# Patient Record
Sex: Female | Born: 1975
Health system: Southern US, Community
[De-identification: ages and names within clinical notes are randomized; demographics above are authoritative.]

---

## 1999-01-10 ENCOUNTER — Other Ambulatory Visit: Admission: RE | Admit: 1999-01-10 | Discharge: 1999-01-10 | Payer: Self-pay | Admitting: Family Medicine

## 2000-03-11 ENCOUNTER — Other Ambulatory Visit: Admission: RE | Admit: 2000-03-11 | Discharge: 2000-03-11 | Payer: Self-pay | Admitting: Obstetrics and Gynecology

## 2001-03-23 ENCOUNTER — Other Ambulatory Visit: Admission: RE | Admit: 2001-03-23 | Discharge: 2001-03-23 | Payer: Self-pay | Admitting: Obstetrics and Gynecology

## 2002-03-23 ENCOUNTER — Other Ambulatory Visit: Admission: RE | Admit: 2002-03-23 | Discharge: 2002-03-23 | Payer: Self-pay | Admitting: Obstetrics and Gynecology

## 2003-04-12 ENCOUNTER — Other Ambulatory Visit: Admission: RE | Admit: 2003-04-12 | Discharge: 2003-04-12 | Payer: Self-pay | Admitting: Obstetrics and Gynecology

## 2004-05-16 ENCOUNTER — Other Ambulatory Visit: Admission: RE | Admit: 2004-05-16 | Discharge: 2004-05-16 | Payer: Self-pay | Admitting: Obstetrics and Gynecology

## 2005-05-21 ENCOUNTER — Other Ambulatory Visit: Admission: RE | Admit: 2005-05-21 | Discharge: 2005-05-21 | Payer: Self-pay | Admitting: Obstetrics and Gynecology

## 2006-12-04 ENCOUNTER — Ambulatory Visit (HOSPITAL_COMMUNITY): Admission: RE | Admit: 2006-12-04 | Discharge: 2006-12-04 | Payer: Self-pay | Admitting: Obstetrics and Gynecology

## 2007-01-08 ENCOUNTER — Ambulatory Visit (HOSPITAL_COMMUNITY): Admission: RE | Admit: 2007-01-08 | Discharge: 2007-01-08 | Payer: Self-pay | Admitting: Obstetrics and Gynecology

## 2007-02-04 ENCOUNTER — Ambulatory Visit (HOSPITAL_COMMUNITY): Admission: RE | Admit: 2007-02-04 | Discharge: 2007-02-04 | Payer: Self-pay | Admitting: Obstetrics and Gynecology

## 2007-02-09 ENCOUNTER — Inpatient Hospital Stay (HOSPITAL_COMMUNITY): Admission: AD | Admit: 2007-02-09 | Discharge: 2007-02-09 | Payer: Self-pay | Admitting: Obstetrics and Gynecology

## 2007-02-12 ENCOUNTER — Inpatient Hospital Stay (HOSPITAL_COMMUNITY): Admission: AD | Admit: 2007-02-12 | Discharge: 2007-02-12 | Payer: Self-pay | Admitting: Obstetrics and Gynecology

## 2007-02-16 ENCOUNTER — Ambulatory Visit: Payer: Self-pay | Admitting: Obstetrics and Gynecology

## 2007-02-18 ENCOUNTER — Ambulatory Visit (HOSPITAL_COMMUNITY): Admission: RE | Admit: 2007-02-18 | Discharge: 2007-02-18 | Payer: Self-pay | Admitting: Obstetrics and Gynecology

## 2007-02-19 ENCOUNTER — Ambulatory Visit: Payer: Self-pay | Admitting: Gynecology

## 2007-02-23 ENCOUNTER — Ambulatory Visit: Payer: Self-pay | Admitting: Obstetrics & Gynecology

## 2007-02-26 ENCOUNTER — Ambulatory Visit: Payer: Self-pay | Admitting: Gynecology

## 2007-03-01 ENCOUNTER — Inpatient Hospital Stay (HOSPITAL_COMMUNITY): Admission: AD | Admit: 2007-03-01 | Discharge: 2007-03-01 | Payer: Self-pay | Admitting: Obstetrics & Gynecology

## 2007-03-04 ENCOUNTER — Ambulatory Visit (HOSPITAL_COMMUNITY): Admission: RE | Admit: 2007-03-04 | Discharge: 2007-03-04 | Payer: Self-pay | Admitting: Obstetrics and Gynecology

## 2007-03-05 ENCOUNTER — Inpatient Hospital Stay (HOSPITAL_COMMUNITY): Admission: RE | Admit: 2007-03-05 | Discharge: 2007-03-09 | Payer: Self-pay | Admitting: Obstetrics & Gynecology

## 2007-03-05 ENCOUNTER — Encounter (INDEPENDENT_AMBULATORY_CARE_PROVIDER_SITE_OTHER): Payer: Self-pay | Admitting: Obstetrics & Gynecology

## 2007-12-14 IMAGING — US US OB COMP EACH ADDL GEST+14 WKS
1 series · 14 of 28 positions shown · non-contrast
Comparison: none

OBSTETRICAL ULTRASOUND:

 This ultrasound exam was performed in the [HOSPITAL] Ultrasound Department.  The OB US report was generated in the AS system, and faxed to the ordering physician.  This report is also available in [REDACTED] PACS.

[Series 1: us ob comp each addl gest+14 wks · 0.32mm/px · 14 of 88 slices shown]
[im 4/88]
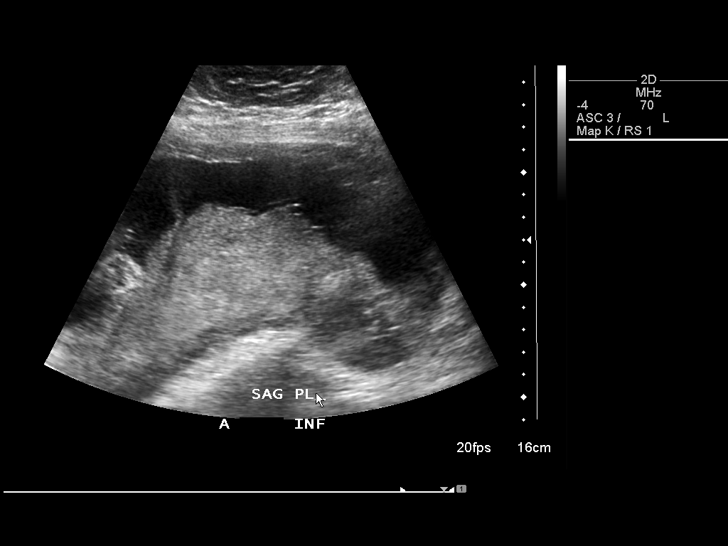
[im 10/88]
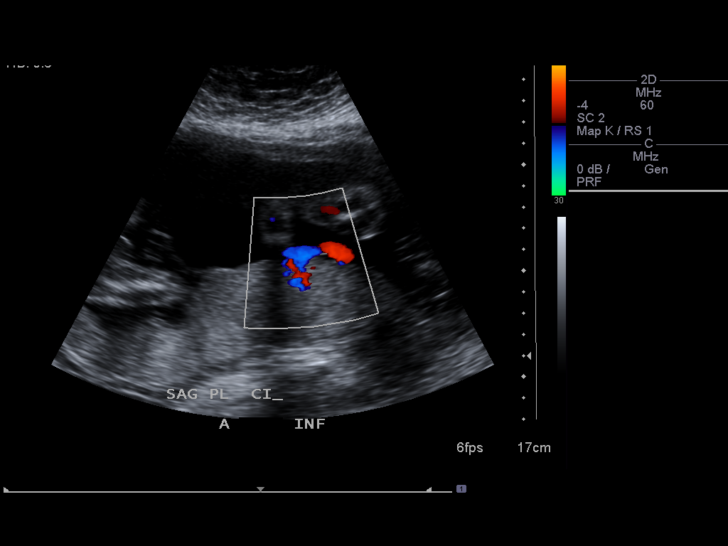
[im 17/88]
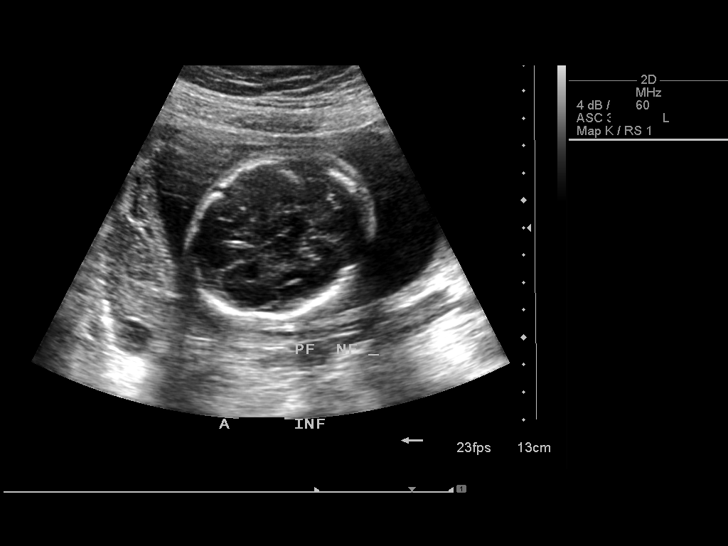
[im 23/88]
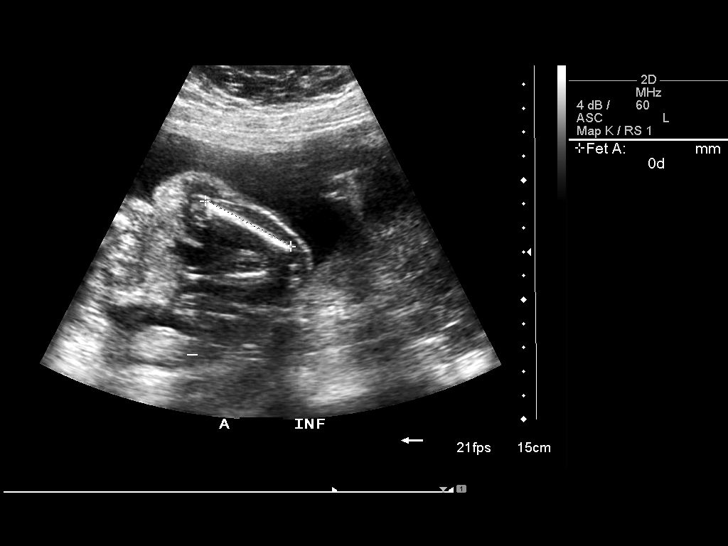
[im 30/88]
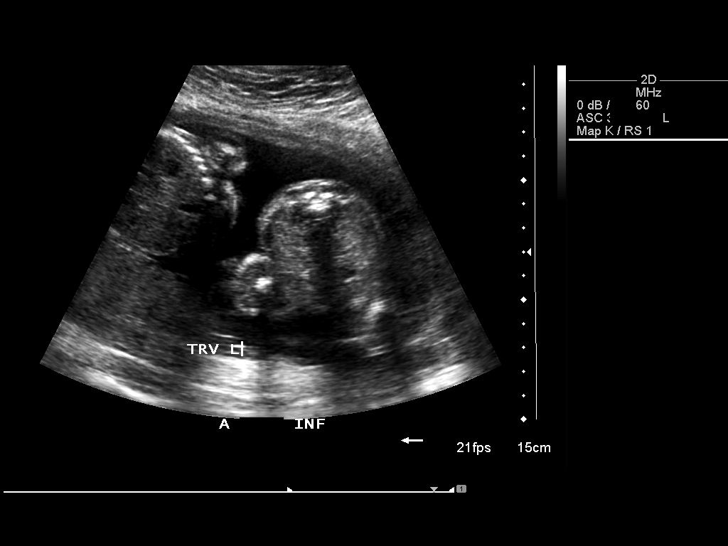
[im 36/88]
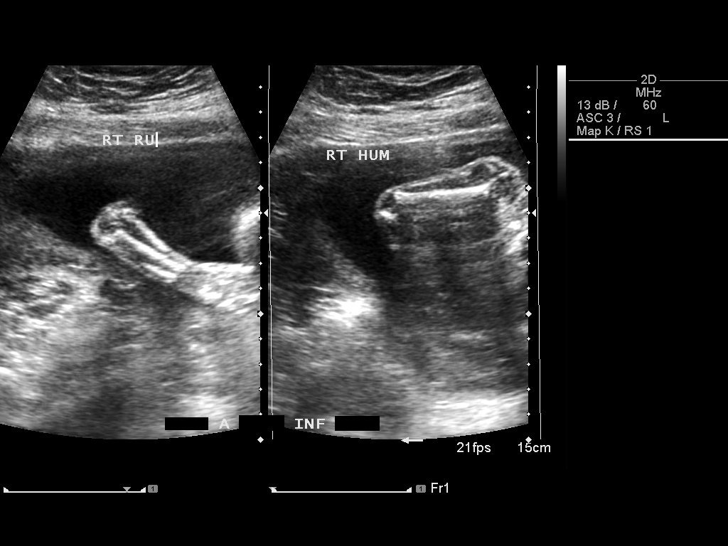
[im 42/88]
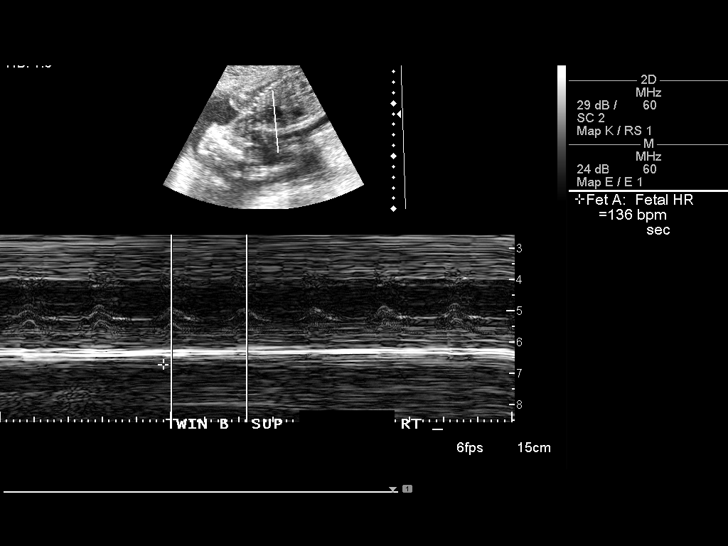
[im 49/88]
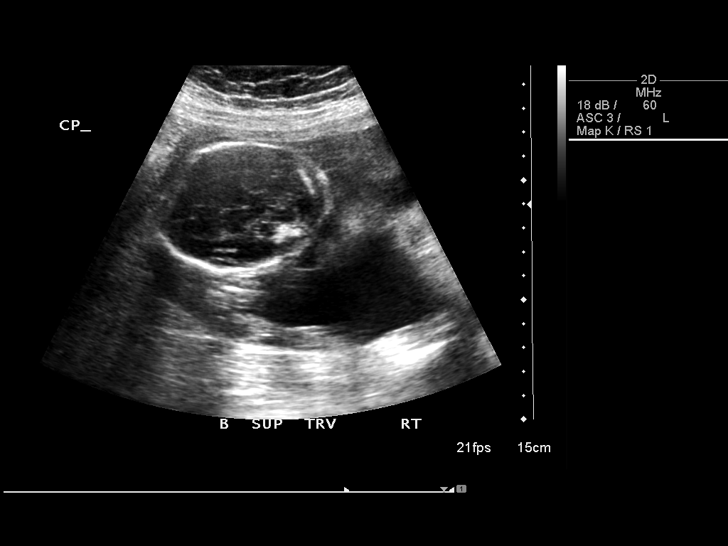
[im 55/88]
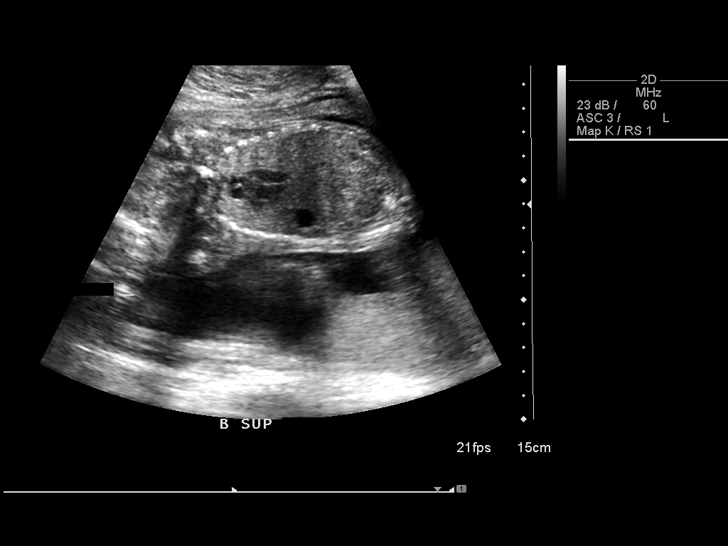
[im 62/88]
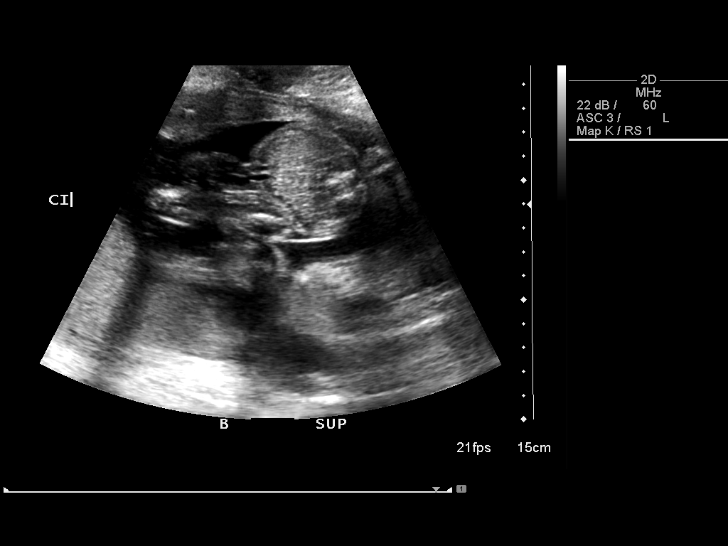
[im 68/88]
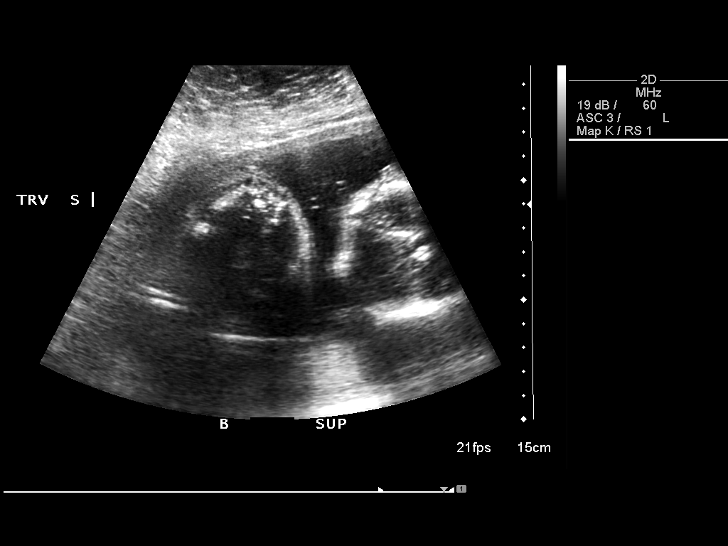
[im 75/88]
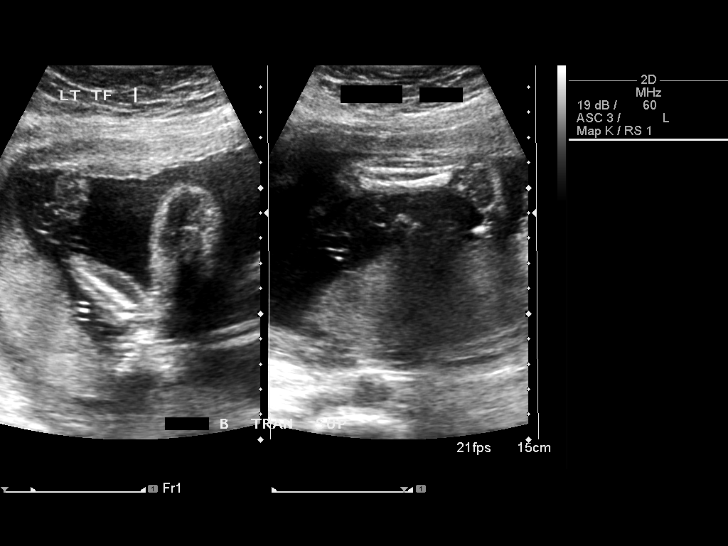
[im 81/88]
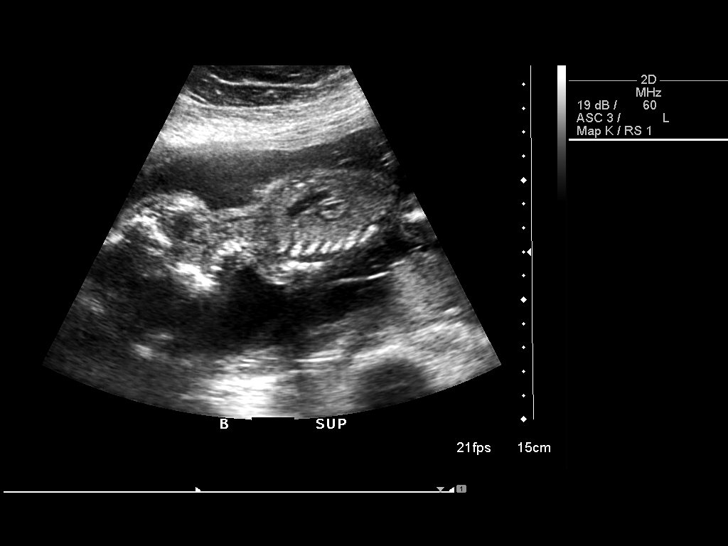
[im 88/88]
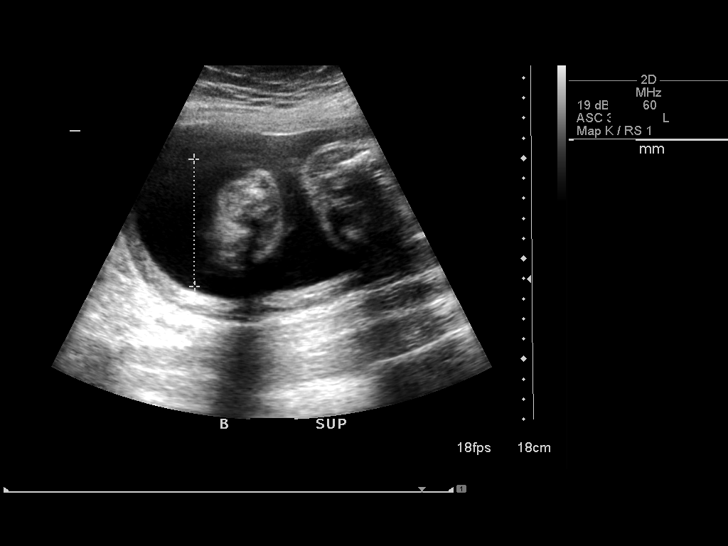

[14 of 28 positions shown; findings below may reference images not displayed]

IMPRESSION: See AS Obstetric US report.

## 2009-01-25 IMAGING — US US OB FOLLOW-UP EACH ADDL GEST (MODIFY)
1 series · 14 of 28 positions shown · non-contrast
Comparison: none

OBSTETRICAL ULTRASOUND:
 This ultrasound was performed in The [HOSPITAL], and the [facility] OB/GYN report will be stored to [REDACTED] PACS.

[Series 1: us ob follow-up each addl gest (modify) · 14 of 38 slices shown]
[im 2/38]
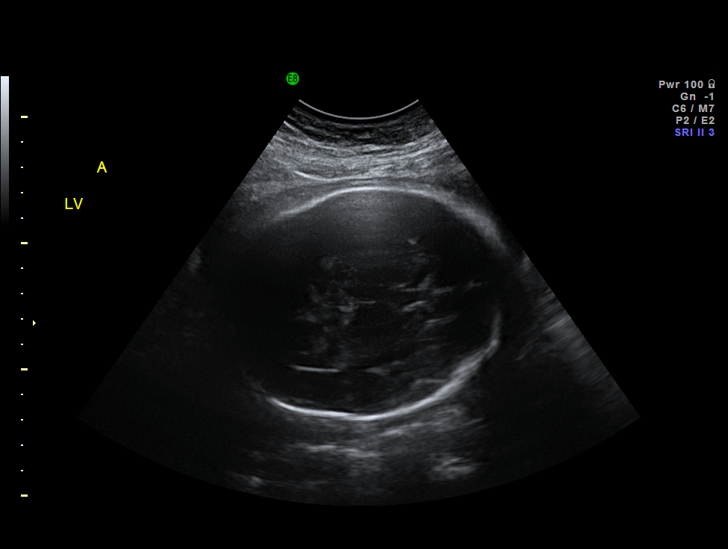
[im 5/38]
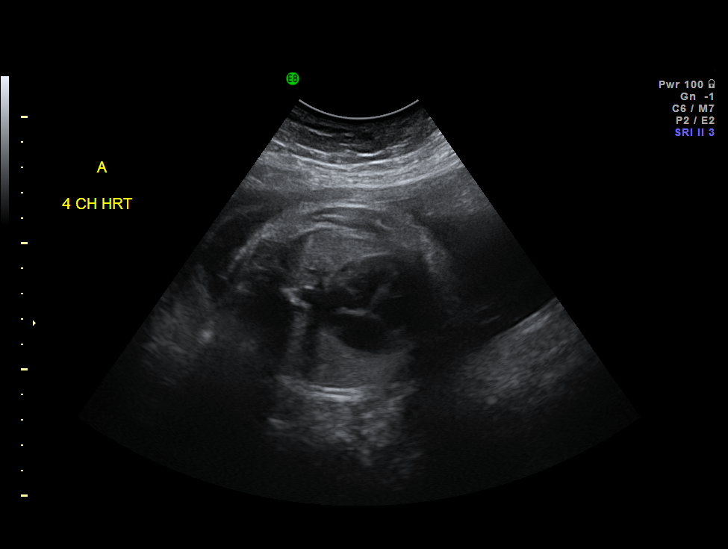
[im 7/38]
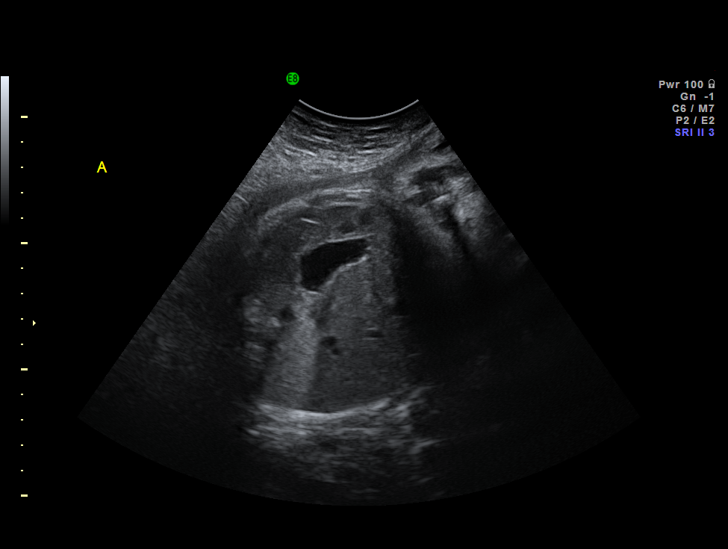
[im 10/38]
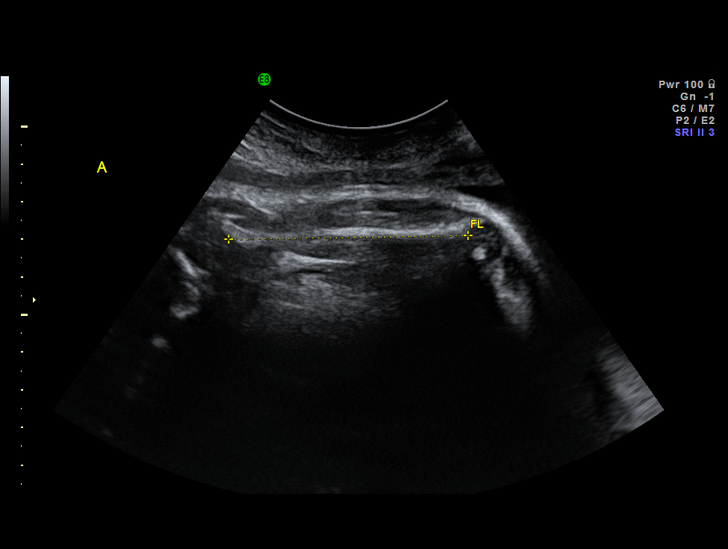
[im 13/38]
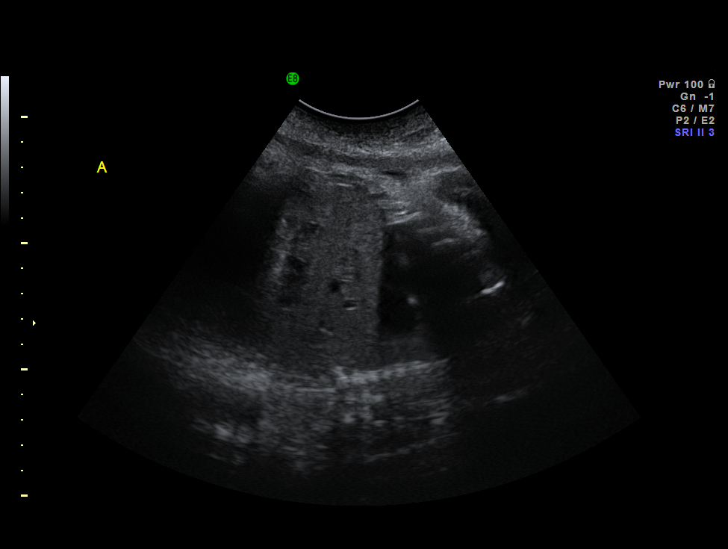
[im 16/38]
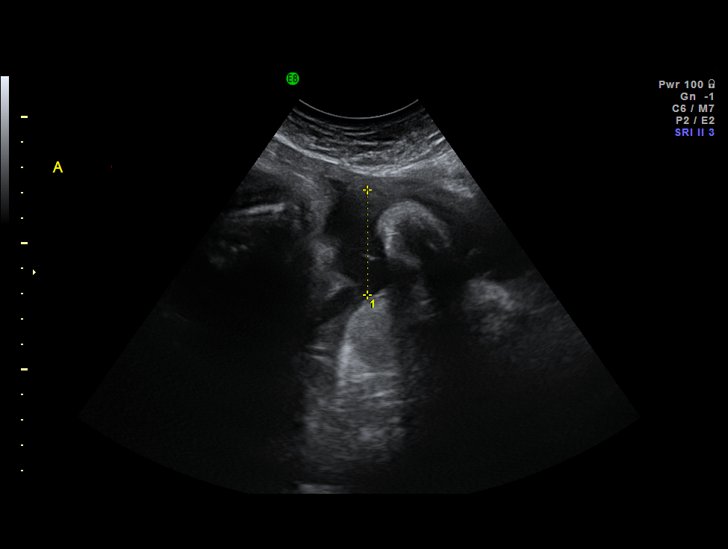
[im 18/38]
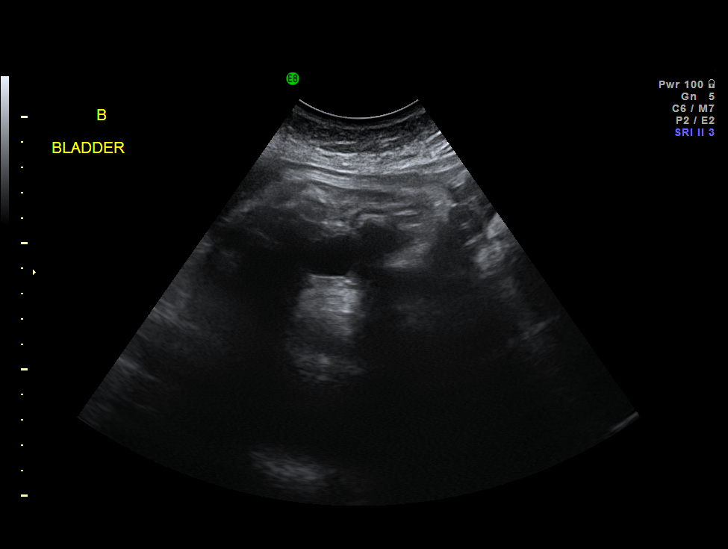
[im 21/38]
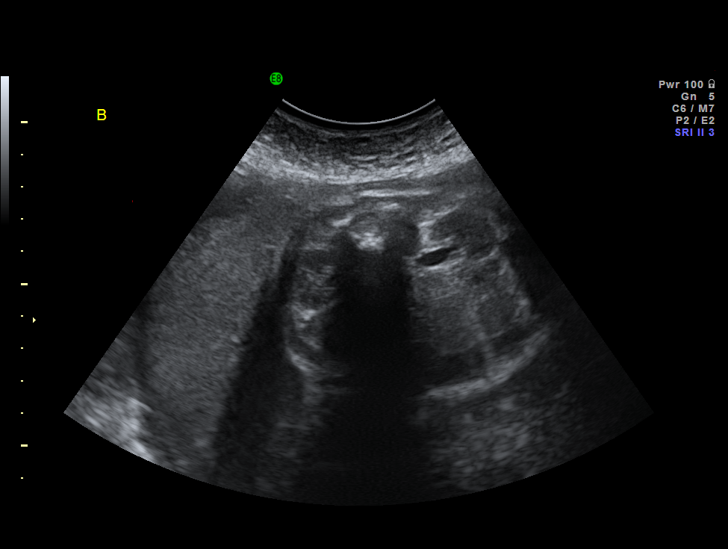
[im 24/38]
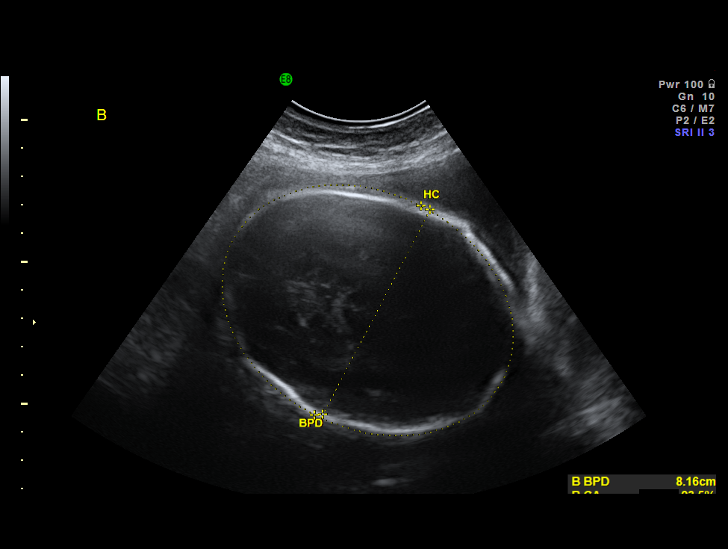
[im 27/38]
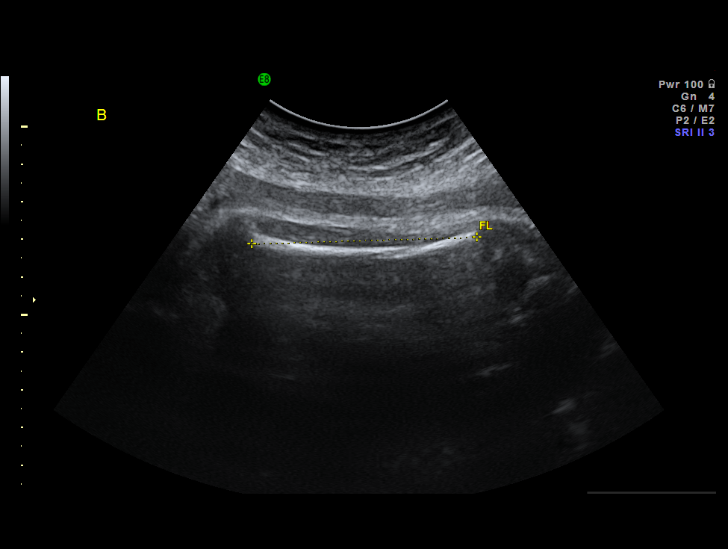
[im 29/38]
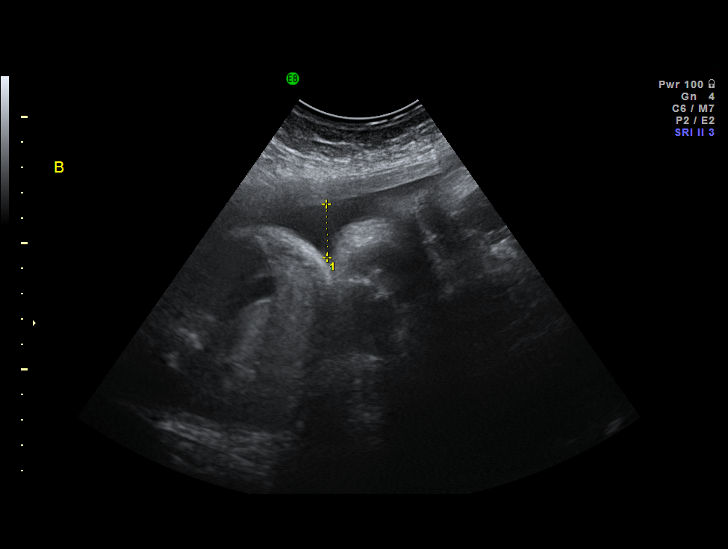
[im 32/38]
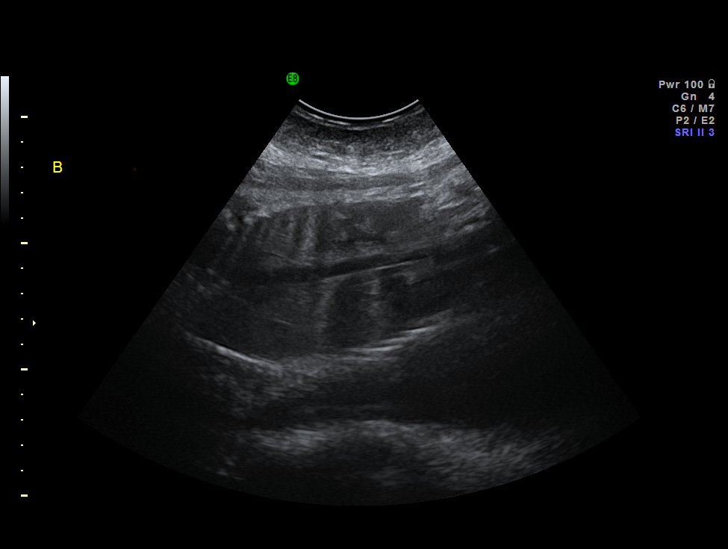
[im 35/38]
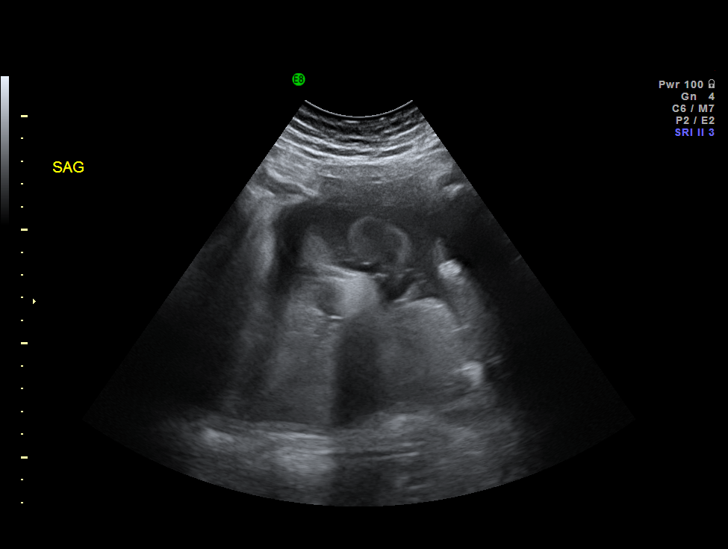
[im 38/38]
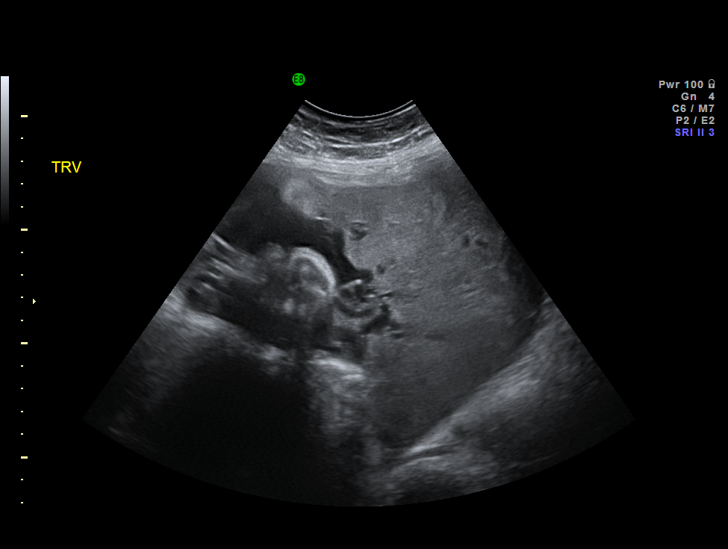

[14 of 28 positions shown; findings below may reference images not displayed]

IMPRESSION: The AS OB/GYN report has also been faxed to the ordering physician.

## 2011-01-08 NOTE — Discharge Summary (Signed)
NAME:  PANAYIOTA, LARKIN              ACCOUNT NO.:  [id number]   MEDICAL RECORD NO.:  [id number]          PATIENT TYPE:  INP   LOCATION:  9120                          FACILITY:  [facility]   PHYSICIAN:  Gerrit Friends. Aldona Bar, M.D.   DATE OF BIRTH:  [birth year]   DATE OF ADMISSION:  03/05/2007  DATE OF DISCHARGE:  03/09/2007                               DISCHARGE SUMMARY   DISCHARGE DIAGNOSES:  1. 35 to 36-week intrauterine pregnancy delivered twins - twin A      vertex 5 pounds 12 ounces female, Apgars 08/09.  Twin B breech 4      pounds 12 ounces female, Apgars 08/09 and discordant growth.  2. Blood type is B positive.   PROCEDURES:  Primary low-transverse cesarean section.   SUMMARY:  This 35 year old female, gravida 1 was admitted for primary  low transverse cesarean section because of discordant growth.  She was  followed by maternal fetal medicine with serial ultrasounds and  apparently on the day prior to admission an ultrasound which revealed  essentially no growth of twin B. The recommendation was for delivery.   As the patient was vertex/transverse plans were made for delivery by  primary cesarean section.   On the morning of July 10, the patient was taken to the operating room  where under spinal anesthetic cesarean delivery was carried out with  delivery of twin A from vertex position and twin B as a double footling  breech extraction.  The mother's postpartum course was benign.  Her  discharge hemoglobin was 9.1 with a white count 10,500 and a platelet  count of 212,000.  At the time of discharge mother was breast-feeding.  Both infants were doing relatively well although there was a question of  cranial bone effusion in twin B and workup was pending at the time of  discharge.   On the morning of July 14, the patient was ambulating well, tolerating a  regular diet well, having normal bowel and bladder function.  Her wound  was clean and dry.  The staples having been removed on the  day prior and  Steri-Stripped with Benzoin.   Mother was given all appropriate instructions at the time of discharge  and understood all instructions well.   DISCHARGE MEDICATIONS:  1. Tylox 1 to 2 every 4-6 hours as needed for severe pain.  2. Motrin 600 mg every 6 hours as needed for cramping or pain.  3. Feosol capsules - one daily.  4. The patient will continue her vitamins as long she is breast-      feeding.   She will return to the office follow-up in approximately four weeks'  time or as needed.  She was given discharge brochure at the time of  discharge and understood all instructions well.   CONDITION ON DISCHARGE:  Improved.      Gerrit Friends. Aldona Bar, M.D.  Electronically Signed     RMW/MEDQ  D:  03/09/2007  T:  03/09/2007  Job:  [id number]

## 2011-01-08 NOTE — Op Note (Signed)
NAME:  Madison Caldwell, Madison Caldwell              ACCOUNT NO.:  [id number]   MEDICAL RECORD NO.:  [id number]          PATIENT TYPE:  INP   LOCATION:  9120                          FACILITY:  [facility]   PHYSICIAN:  Gerrit Friends. Aldona Bar, M.D.   DATE OF BIRTH:  December 07, 1975   DATE OF PROCEDURE:  03/05/2007  DATE OF DISCHARGE:                               OPERATIVE REPORT   PREOPERATIVE DIAGNOSIS:  35 to 36 week intrauterine pregnancy, twins,  discordant growth, vertex breech presentation.   POSTOPERATIVE DIAGNOSIS:  35 to 36 week intrauterine pregnancy, twins,  discordant growth, vertex breech presentation, plus delivery of twin A -  vertex - 5 pounds 12 ounces - female - Apgars 8/9; delivered twin B -  total breech extraction - 4 pounds 12 ounces - female - Apgar 8/9.   PROCEDURE:  Primary low transverse cesarean section.   SURGEON:  Gerrit Friends. Aldona Bar, M.D.   ASSISTANT:  Miguel Aschoff, M.D.   ANESTHESIA:  Spinal, Dr. Arby Barrette.   HISTORY:  This 35 year old female with known twins was being followed by  [facility] and on July 9, had an ultrasound  consistent with essentially no growth of twin B over the previous two  weeks' time.  This led to the recommendation of delivery.  Dopplers were  normal on the afternoon of July 9 and arrangements were made for  cesarean section on the morning of July 10.  The baby's presentations  are vertex transverse to breech.  The patient is now being taken to the  operating room for delivery by primary low transverse cesarean section.  She understands that at 35-36 weeks' gestation, there may be a need for  intensive care nursery care post delivery but also understands  completely the rationale involved in getting her delivered at this point  in time and also the rationale for delivery by primary low transverse  cesarean section.   PROCEDURE IN DETAIL:  The patient was taken to the operating room where,  after satisfactory induction of spinal anesthetic by  Dr. Arby Barrette, the  patient was prepped and draped, having been placed in the supine  position and slightly tilted to the left.  A Foley catheter was placed  before the prep.  Once the patient was adequately draped and good  anesthetic levels were documented, the procedure was begun.   A Pfannenstiel incision was made with minimal difficulty and dissected  down sharply to and through the fascia in a low transverse fashion with  hemostasis created at each layer.  The subfascial space was created  inferiorly and superiorly and muscles separated in the midline.  The  peritoneum was identified and entered appropriately with care taken to  avoid the bowel superiorly and the bladder inferiorly.  At this time,  the vesicouterine peritoneum was incised in a low transverse fashion and  pushed off the lower uterine segment with ease.  Sharp incision into the  uterus in the low transverse fashion was then carried out with  Metzenbaum scissors and extended laterally.  The amniotomy produced twin  A - clear fluid - and thereafter, from vertex  position, twin A was  delivered.  Twin A was female with Apgars of 8/9, subsequent weight was  found to be 5 pounds 12 ounces and, after delivery and after the cord  was clamped and cut, the infant was passed off to the waiting team.   Thereafter, inspection of the uterus internally was carried out and twin  B was found to be in a double footling breech presentation.  The feet  were grasped, amniotomy produced again with production of clear fluid,  and thereafter, using a double footling breech extraction, twin B was  delivered.  Infant B was female and after the cord was clamped and cut,  was passed off to the awaiting team.  Subsequent weight found to be 4  pounds 12 ounces and Apgars noted to be 8/9.   At this time, cord bloods were collected from twin A and twin B  respectively and labeled appropriately.  The placenta was delivered and  sent to pathology  labeled appropriately.  The uterus was exteriorized  and rendered free of any remaining products of conception.  Good uterine  contractility was afforded, was slowly given intravenous Pitocin and  manual stimulation.  At this time, the uterine incision was closed using  a single layer of #1 Vicryl in a running locking fashion.  Hemostasis  was excellent.  The tubes and ovaries were noted to be normal.  There  was a small 2 cm pedunculated myoma in the right cornua of the uterus.  Otherwise, the uterus, tubes and ovaries appeared completely normal.  The uterus was replaced in the abdominal cavity and after all counts  were noted to be correct and no foreign bodies were noted to be  remaining in the abdominal cavity, and after the abdomen was lavaged and  the uterine incision noted to be dry, closure of the abdomen was done in  layers.   The abdominal peritoneum was closed with 0 Vicryl in a running fashion  and muscles secured with same.  After assuring good fascial hemostasis,  the fascia was then reapproximated from angle to midline bilaterally  using 0 Vicryl suture in a running fashion.  Thereafter, the  subcutaneous tissues were hemostatic and staples were used to close  skin.  A sterile pressure dressing was applied and the patient was  transported to the recovery room in satisfactory condition and tolerated  the procedure well.  Estimated blood loss 500 mL.  All counts correct  x2.  At the conclusion of the procedure, both mother and babies were  doing well in their respective recovery areas.  Infants were taken to  the newborn nursery and at this time, were doing well in the newborn  nursery.   In summary, this patient was delivered by primary low transverse  cesarean section at 35-[redacted] weeks gestation with known twins and  discordant growth in twin B.  Maternal condition on arrival in recovery  was satisfactory.      Gerrit Friends. Aldona Bar, M.D.  Electronically Signed      RMW/MEDQ  D:  03/05/2007  T:  03/05/2007  Job:  [id number]

## 2011-06-11 LAB — COMPREHENSIVE METABOLIC PANEL
ALT: 11
Albumin: 2.6 — ABNORMAL LOW
Alkaline Phosphatase: 128 — ABNORMAL HIGH
Calcium: 9
Potassium: 4.2
Sodium: 137
Total Protein: 5.4 — ABNORMAL LOW

## 2011-06-11 LAB — CBC
HCT: 27 — ABNORMAL LOW
Hemoglobin: 9.1 — ABNORMAL LOW
MCV: 88.7
MCV: 91.4
Platelets: 146 — ABNORMAL LOW
Platelets: 212
RBC: 3.02 — ABNORMAL LOW
RDW: 13.2
WBC: 10.5
WBC: 8.4

## 2011-06-11 LAB — URINALYSIS, ROUTINE W REFLEX MICROSCOPIC
Glucose, UA: NEGATIVE
Hgb urine dipstick: NEGATIVE
Specific Gravity, Urine: 1.025
Urobilinogen, UA: 0.2

## 2011-06-11 LAB — SAMPLE TO BLOOD BANK

## 2011-08-06 ENCOUNTER — Other Ambulatory Visit: Payer: Self-pay | Admitting: Obstetrics and Gynecology

## 2013-09-13 ENCOUNTER — Other Ambulatory Visit: Payer: Self-pay | Admitting: Obstetrics and Gynecology

## 2014-10-10 ENCOUNTER — Other Ambulatory Visit: Payer: Self-pay | Admitting: Obstetrics and Gynecology

## 2014-10-11 LAB — CYTOLOGY - PAP

## 2015-11-27 ENCOUNTER — Other Ambulatory Visit: Payer: Self-pay | Admitting: Obstetrics and Gynecology

## 2015-11-28 LAB — CYTOLOGY - PAP

## 2017-01-06 DIAGNOSIS — H00029 Hordeolum internum unspecified eye, unspecified eyelid: Secondary | ICD-10-CM | POA: Diagnosis not present

## 2017-02-03 ENCOUNTER — Other Ambulatory Visit: Payer: Self-pay | Admitting: Obstetrics and Gynecology

## 2017-02-03 DIAGNOSIS — Z01419 Encounter for gynecological examination (general) (routine) without abnormal findings: Secondary | ICD-10-CM | POA: Diagnosis not present

## 2017-02-03 DIAGNOSIS — Z124 Encounter for screening for malignant neoplasm of cervix: Secondary | ICD-10-CM | POA: Diagnosis not present

## 2017-02-03 DIAGNOSIS — Z1231 Encounter for screening mammogram for malignant neoplasm of breast: Secondary | ICD-10-CM | POA: Diagnosis not present

## 2017-02-03 DIAGNOSIS — Z6832 Body mass index (BMI) 32.0-32.9, adult: Secondary | ICD-10-CM | POA: Diagnosis not present

## 2017-02-06 LAB — CYTOLOGY - PAP

## 2017-04-11 DIAGNOSIS — M25562 Pain in left knee: Secondary | ICD-10-CM | POA: Diagnosis not present

## 2017-04-11 DIAGNOSIS — M21611 Bunion of right foot: Secondary | ICD-10-CM | POA: Diagnosis not present

## 2017-04-11 DIAGNOSIS — M179 Osteoarthritis of knee, unspecified: Secondary | ICD-10-CM | POA: Diagnosis not present

## 2017-05-08 ENCOUNTER — Ambulatory Visit: Payer: Self-pay | Admitting: Sports Medicine

## 2017-05-28 ENCOUNTER — Encounter (INDEPENDENT_AMBULATORY_CARE_PROVIDER_SITE_OTHER): Payer: Self-pay

## 2017-05-28 ENCOUNTER — Ambulatory Visit (INDEPENDENT_AMBULATORY_CARE_PROVIDER_SITE_OTHER): Payer: BLUE CROSS/BLUE SHIELD | Admitting: Sports Medicine

## 2017-05-28 ENCOUNTER — Encounter: Payer: Self-pay | Admitting: Sports Medicine

## 2017-05-28 ENCOUNTER — Ambulatory Visit (INDEPENDENT_AMBULATORY_CARE_PROVIDER_SITE_OTHER): Payer: BLUE CROSS/BLUE SHIELD

## 2017-05-28 VITALS — BP 107/66 | HR 75 | Ht 67.0 in | Wt 185.0 lb

## 2017-05-28 DIAGNOSIS — M79671 Pain in right foot: Secondary | ICD-10-CM | POA: Diagnosis not present

## 2017-05-28 DIAGNOSIS — M21619 Bunion of unspecified foot: Secondary | ICD-10-CM | POA: Diagnosis not present

## 2017-05-28 MED ORDER — MELOXICAM 15 MG PO TABS
15.0000 mg | ORAL_TABLET | Freq: Every day | ORAL | 0 refills | Status: DC
Start: 2017-05-28 — End: 2021-02-13

## 2017-05-28 NOTE — Progress Notes (Signed)
Subjective: Madison Caldwell is a 41 y.o. female patient who presents to office for evaluation of Right> Left bunion pain. Patient complains of progressive pain especially over the last years in the Right>Left foot that starts as pain over the bump with direct pressure and range of motion; patient now has difficulty fitting shoes comfortably. Reports pain is now interferring with daily activities. Depending on what she is doing. States that her foot feels better with flats or shoes that are wide and condition. States that it's worse with dress shoes. Has tried Motrin with no relief. Patient admits to a family history of bunions. Reports that she can remember having bunions. All her life, but most recently it has become painful.. Patient denies any other pedal complaints.   Review of systems all system reviewed positive for environment allergies  There are no active problems to display for this patient.   No current outpatient prescriptions on file prior to visit.   No current facility-administered medications on file prior to visit.     No Known Allergies  Objective:  General: Alert and oriented x3 in no acute distress  Dermatology: No open lesions bilateral lower extremities, no webspace macerations, no ecchymosis bilateral, all nails x 10 are well manicured.  Vascular: Dorsalis Pedis and Posterior Tibial pedal pulses 2/4, Capillary Fill Time 3 seconds, (+) pedal hair growth bilateral, no edema bilateral lower extremities, Temperature gradient within normal limits.  Neurology: Michaell Cowing sensation intact via light touch bilateral,Negative Tinels sign bilateral.   Musculoskeletal: Mild tenderness with palpation right>left bunion deformity, with limitation without crepitus with range of motion, deformity reducible, tracking not trackbound, there is no 1st ray hypermobility noted bilateral. Midtarsal, Subtalar joint, and ankle joint range of motion is within normal limits. On weightbearing exam,  there is decreased 1st MTPJ rom Right>Left with functional limitus noted, there is medial arch collapse Right> Left on weightbearing, rearfoot slight valgus, forefoot slight abduction with HAV deformity supported on ground with no second toe crossover deformity noted. Mild lesser hammertoe.  Gait none assisted  Xrays  Right Foot    Impression: Intermetatarsal angle above normal limits , suggestive of bunion deformity with first metatarsophalangeal joint space narrowing, there is mild midtarsal breach, suggestive of flexible pes planus deformity, there is very mild varus rotated fifth hammertoe. No other acute findings.      Assessment and Plan: Problem List Items Addressed This Visit    None    Visit Diagnoses    Bunion    -  Primary   Relevant Medications   meloxicam (MOBIC) 15 MG tablet   Other Relevant Orders   DG Foot Complete Right   Right foot pain           -Complete examination performed -Xrays reviewed -Discussed treatement options; discussed HAV deformity with limitus ;conservative and  Surgical management; risks, benefits, alternatives discussed. All patient's questions answered. -Dispensed silicone bunion pad -Rx meloxicam to take as needed -Recommend continue with good supportive shoes and inserts.  -Patient to return to office as needed or sooner if condition worsens. Patient would like to consider surgery after the first of the year.  Asencion Islam, DPM

## 2017-05-28 NOTE — Progress Notes (Signed)
   Subjective:    Patient ID: Madison Caldwell, female    DOB: August 20, 1976, 41 y.o.   MRN: [id number]  HPI  I have a bunion on my right big toe.  Hurts more often and swells sometimes.     Review of Systems  Allergic/Immunologic: Positive for environmental allergies.  All other systems reviewed and are negative.      Objective:   Physical Exam        Assessment & Plan:

## 2017-05-28 NOTE — Patient Instructions (Signed)

## 2018-02-05 DIAGNOSIS — Z01419 Encounter for gynecological examination (general) (routine) without abnormal findings: Secondary | ICD-10-CM | POA: Diagnosis not present

## 2018-02-05 DIAGNOSIS — Z124 Encounter for screening for malignant neoplasm of cervix: Secondary | ICD-10-CM | POA: Diagnosis not present

## 2018-02-05 DIAGNOSIS — Z1231 Encounter for screening mammogram for malignant neoplasm of breast: Secondary | ICD-10-CM | POA: Diagnosis not present

## 2018-03-26 DIAGNOSIS — M7661 Achilles tendinitis, right leg: Secondary | ICD-10-CM | POA: Diagnosis not present

## 2018-09-08 DIAGNOSIS — Z0000 Encounter for general adult medical examination without abnormal findings: Secondary | ICD-10-CM | POA: Diagnosis not present

## 2018-09-08 DIAGNOSIS — Z6833 Body mass index (BMI) 33.0-33.9, adult: Secondary | ICD-10-CM | POA: Diagnosis not present

## 2019-01-14 DIAGNOSIS — M25559 Pain in unspecified hip: Secondary | ICD-10-CM | POA: Diagnosis not present

## 2019-01-14 DIAGNOSIS — S300XXA Contusion of lower back and pelvis, initial encounter: Secondary | ICD-10-CM | POA: Diagnosis not present

## 2019-01-14 DIAGNOSIS — S3993XA Unspecified injury of pelvis, initial encounter: Secondary | ICD-10-CM | POA: Diagnosis not present

## 2019-01-14 DIAGNOSIS — W109XXA Fall (on) (from) unspecified stairs and steps, initial encounter: Secondary | ICD-10-CM | POA: Diagnosis not present

## 2019-02-15 DIAGNOSIS — Z124 Encounter for screening for malignant neoplasm of cervix: Secondary | ICD-10-CM | POA: Diagnosis not present

## 2019-02-15 DIAGNOSIS — Z6834 Body mass index (BMI) 34.0-34.9, adult: Secondary | ICD-10-CM | POA: Diagnosis not present

## 2019-02-15 DIAGNOSIS — Z1231 Encounter for screening mammogram for malignant neoplasm of breast: Secondary | ICD-10-CM | POA: Diagnosis not present

## 2019-02-15 DIAGNOSIS — Z01419 Encounter for gynecological examination (general) (routine) without abnormal findings: Secondary | ICD-10-CM | POA: Diagnosis not present

## 2019-06-08 DIAGNOSIS — J0100 Acute maxillary sinusitis, unspecified: Secondary | ICD-10-CM | POA: Diagnosis not present

## 2019-06-08 DIAGNOSIS — Z20828 Contact with and (suspected) exposure to other viral communicable diseases: Secondary | ICD-10-CM | POA: Diagnosis not present

## 2019-06-10 DIAGNOSIS — M545 Low back pain: Secondary | ICD-10-CM | POA: Diagnosis not present

## 2019-08-25 DIAGNOSIS — Z20828 Contact with and (suspected) exposure to other viral communicable diseases: Secondary | ICD-10-CM | POA: Diagnosis not present

## 2019-08-29 DIAGNOSIS — Z20828 Contact with and (suspected) exposure to other viral communicable diseases: Secondary | ICD-10-CM | POA: Diagnosis not present

## 2020-07-29 DIAGNOSIS — J029 Acute pharyngitis, unspecified: Secondary | ICD-10-CM | POA: Diagnosis not present

## 2021-02-12 NOTE — Progress Notes (Signed)
Subjective:  Patient ID: Madison Caldwell, female    DOB: 03-07-1976  Age: 45 y.o. MRN: [id number]  Chief Complaint  Patient presents with   cpe    HPI  Well Adult Physical: Madison Caldwell is a 45 year old Caucasian female that is present for a comprehensive physical exam. She is up-to-date on mammogram, routine eye and dental exams. 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 Madison Caldwell states she is experiencing intermittent GERD symptoms of bilious reflux, bloating, and belching. She had her gallbladder removed several years ago. States symptoms were relieved by taking her spouse's GERD medication. States she has experienced some intermittent tenderness and swelling to left anterior neck with mild swallowing difficulty. She denies previous thyroid enlargement or thyroid disease. Madison Caldwell does tell me that she has experienced constipation and dry skin.   Encounter for general adult medical examination without abnormal findings  Physical ("At Risk" items are starred): Patient's last physical exam was 1 year ago .  Smoking: Life-long non-smoker ;  Physical Activity: Exercises at least 3 times per week ;  Alcohol/Drug Use: Is a non-drinker; No illicit drug use ;  Patient is not afflicted from Stress Incontinence and Urge Incontinence  Safety: reviewed. Patient wears a seat belt, has smoke detectors, has carbon monoxide detectors, practices appropriate gun safety, and wears sunscreen with extended sun exposure. Dental Care: biannual cleanings, brushes and flosses daily. Ophthalmology/Optometry: Annual visit.  Hearing loss: none Vision impairments: Glasses   Menarche: 45 y.o Menstrual History: Regular LMP: 02/01/2021 Pregnancy history: 2 live births Safe at home: Yes Self breast exams: Yes  Mammogram- Last year due in August-[facility] will schedule.  Flowsheet Row Office Visit from  02/13/2021 in [facility]  PHQ-2 Total Score 0          Social Hx   Social History   Socioeconomic History   Marital status: Married    Spouse name: Not on file   Number of children: Not on file   Years of education: Not on file   Highest education level: Not on file  Occupational History   Not on file  Tobacco Use   Smoking status: Never   Smokeless tobacco: Never  Substance and Sexual Activity   Alcohol use: Not on file   Drug use: Not on file   Sexual activity: Not on file  Other Topics Concern   Not on file  Social History Narrative   Not on file   Social Determinants of Health   Financial Resource Strain: Not on file  Food Insecurity: Not on file  Transportation Needs: Not on file  Physical Activity: Not on file  Stress: Not on file  Social Connections: Not on file    Review of Systems  Constitutional:  Negative for chills, fatigue and fever.  HENT:  Positive for postnasal drip and trouble swallowing (intermittently, tenderness and swelling anterior neck). Negative for congestion, ear pain, rhinorrhea and sore throat.   Eyes:  Positive for visual disturbance (wears prescription glasses).  Respiratory:  Negative for cough and shortness of breath.   Cardiovascular:  Negative for chest pain.  Gastrointestinal:  Positive for constipation. Negative for abdominal pain, diarrhea, nausea and vomiting.       GERD intermittent  Endocrine: Negative.   Genitourinary:  Negative for dysuria and urgency.  Musculoskeletal:  Positive for back pain (intermittent). Negative for myalgias.  Skin:        Dry  skin  Allergic/Immunologic: Positive for environmental allergies.  Neurological:  Negative for dizziness, weakness, light-headedness and headaches.  Psychiatric/Behavioral:  Negative for dysphoric mood. The patient is not nervous/anxious.     Objective:  BP 124/84   Pulse 90   Temp 97.6 F (36.4 C)   Ht 5\' 7"  (1.702 m)   Wt 226 lb (102.5 kg)   LMP 02/01/2021    SpO2 98%   BMI 35.40 kg/m   BP/Weight 02/13/2021 05/28/2017  Systolic BP 124 107  Diastolic BP 84 66  Wt. (Lbs) 226 185  BMI 35.4 28.98    Physical Exam Vitals reviewed.  Constitutional:      Appearance: Normal appearance.  HENT:     Right Ear: Tympanic membrane normal.     Left Ear: Tympanic membrane normal.     Nose: Nose normal.     Mouth/Throat:     Mouth: Mucous membranes are moist.     Pharynx: No oropharyngeal exudate or posterior oropharyngeal erythema.  Eyes:     Conjunctiva/sclera: Conjunctivae normal.     Comments: Eyeglasses in place  Cardiovascular:     Rate and Rhythm: Normal rate and regular rhythm.     Pulses: Normal pulses.     Heart sounds: Normal heart sounds.  Pulmonary:     Effort: Pulmonary effort is normal.     Breath sounds: Normal breath sounds.  Abdominal:     General: Bowel sounds are normal.     Palpations: Abdomen is soft. There is no mass.     Tenderness: There is no abdominal tenderness.  Musculoskeletal:        General: Normal range of motion.     Cervical back: Normal range of motion and neck supple. Tenderness present.     Thoracic back: Normal.     Lumbar back: Tenderness present.  Skin:    General: Skin is warm and dry.     Capillary Refill: Capillary refill takes less than 2 seconds.     Findings: No lesion.  Neurological:     General: No focal deficit present.     Mental Status: She is alert and oriented to person, place, and time.  Psychiatric:        Mood and Affect: Mood normal.        Behavior: Behavior normal.    Lab Results  Component Value Date   WBC 10.5 03/08/2007   HGB 9.1 (L) 03/08/2007   HCT 27.0 (L) 03/08/2007   PLT 212 DELTA CHECK NOTED 03/08/2007   GLUCOSE 72 03/05/2007   ALT 11 03/05/2007   AST 20 03/05/2007   NA 137 03/05/2007   K 4.2 03/05/2007   CL 106 03/05/2007   CREATININE 0.72 03/05/2007   BUN 3 (L) 03/05/2007   CO2 23 03/05/2007      Assessment & Plan:   1. Physical exam - CBC with  Differential/Platelet; Future - Comprehensive metabolic panel; Future - Hemoglobin A1c; Future - TSH; Future - VITAMIN D 25 Hydroxy (Vit-D Deficiency, Fractures); Future - Lipid panel; Future  2. BMI 35.0-35.9,adult - CBC with Differential/Platelet; Future - Comprehensive metabolic panel; Future - Hemoglobin A1c; Future - TSH; Future - VITAMIN D 25 Hydroxy (Vit-D Deficiency, Fractures); Future - Lipid panel; Future       Body mass index is 35.4 kg/m.   These are the goals we discussed:  Goals   Increase physical activity    Return in morning for fasting labs Avoid foods that trigger GERD symptoms Return in 1-year for  physical exam  This is a list of the screening recommended for you and due dates:  Health Maintenance  Topic Date Due   HIV Screening  Never done   Hepatitis C Screening: USPSTF Recommendation to screen - Ages 53-79 yo.  Never done   Tetanus Vaccine  Never done   Pap Smear  02/04/2020   Flu Shot  03/26/2021   Pneumococcal Vaccination  Aged Out   HPV Vaccine  Aged Out   COVID-19 Vaccine  Discontinued     AN INDIVIDUALIZED CARE PLAN: was established or reinforced today.   SELF MANAGEMENT: The patient and I together assessed ways to personally work towards obtaining the recommended goals  Support needs The patient and/or family needs were assessed and services were offered if appropriate.    Follow-up: 1-year, pt returning in morning for fasting labs  An After Visit Summary was printed and given to the patient.  Signed, Janie Morning, NP [facility] [phone]

## 2021-02-13 ENCOUNTER — Ambulatory Visit (INDEPENDENT_AMBULATORY_CARE_PROVIDER_SITE_OTHER): Payer: BC Managed Care – PPO | Admitting: Nurse Practitioner

## 2021-02-13 ENCOUNTER — Other Ambulatory Visit: Payer: Self-pay

## 2021-02-13 ENCOUNTER — Encounter: Payer: Self-pay | Admitting: Nurse Practitioner

## 2021-02-13 VITALS — BP 124/84 | HR 90 | Temp 97.6°F | Ht 67.0 in | Wt 226.0 lb

## 2021-02-13 DIAGNOSIS — Z0000 Encounter for general adult medical examination without abnormal findings: Secondary | ICD-10-CM

## 2021-02-13 DIAGNOSIS — Z6835 Body mass index (BMI) 35.0-35.9, adult: Secondary | ICD-10-CM

## 2021-02-13 NOTE — Patient Instructions (Signed)
Preventive Care 40-44 Years Old, Female Preventive care refers to lifestyle choices and visits with your health care provider that can promote health and wellness. This includes: A yearly physical exam. This is also called an annual wellness visit. Regular dental and eye exams. Immunizations. Screening for certain conditions. Healthy lifestyle choices, such as: Eating a healthy diet. Getting regular exercise. Not using drugs or products that contain nicotine and tobacco. Limiting alcohol use. What can I expect for my preventive care visit? Physical exam Your health care provider will check your: Height and weight. These may be used to calculate your BMI (body mass index). BMI is a measurement that tells if you are at a healthy weight. Heart rate and blood pressure. Body temperature. Skin for abnormal spots. Counseling Your health care provider may ask you questions about your: Past medical problems. Family's medical history. Alcohol, tobacco, and drug use. Emotional well-being. Home life and relationship well-being. Sexual activity. Diet, exercise, and sleep habits. Work and work environment. Access to firearms. Method of birth control. Menstrual cycle. Pregnancy history. What immunizations do I need?  Vaccines are usually given at various ages, according to a schedule. Your health care provider will recommend vaccines for you based on your age, medicalhistory, and lifestyle or other factors, such as travel or where you work. What tests do I need? Blood tests Lipid and cholesterol levels. These may be checked every 5 years, or more often if you are over 50 years old. Hepatitis C test. Hepatitis B test. Screening Lung cancer screening. You may have this screening every year starting at age 55 if you have a 30-pack-year history of smoking and currently smoke or have quit within the past 15 years. Colorectal cancer screening. All adults should have this screening starting at  age 50 and continuing until age 75. Your health care provider may recommend screening at age 45 if you are at increased risk. You will have tests every 1-10 years, depending on your results and the type of screening test. Diabetes screening. This is done by checking your blood sugar (glucose) after you have not eaten for a while (fasting). You may have this done every 1-3 years. Mammogram. This may be done every 1-2 years. Talk with your health care provider about when you should start having regular mammograms. This may depend on whether you have a family history of breast cancer. BRCA-related cancer screening. This may be done if you have a family history of breast, ovarian, tubal, or peritoneal cancers. Pelvic exam and Pap test. This may be done every 3 years starting at age 21. Starting at age 30, this may be done every 5 years if you have a Pap test in combination with an HPV test. Other tests STD (sexually transmitted disease) testing, if you are at risk. Bone density scan. This is done to screen for osteoporosis. You may have this scan if you are at high risk for osteoporosis. Talk with your health care provider about your test results, treatment options,and if necessary, the need for more tests. Follow these instructions at home: Eating and drinking  Eat a diet that includes fresh fruits and vegetables, whole grains, lean protein, and low-fat dairy products. Take vitamin and mineral supplements as recommended by your health care provider. Do not drink alcohol if: Your health care provider tells you not to drink. You are pregnant, may be pregnant, or are planning to become pregnant. If you drink alcohol: Limit how much you have to 0-1 drink a day. Be aware   aware of how much alcohol is in your drink. In the [place] one drink equals one 12 oz bottle of beer (355 mL), one 5 oz glass of wine (148 mL), or one 1 oz glass of hard liquor (44 mL).  Lifestyle Take daily care of your teeth and  gums. Brush your teeth every morning and night with fluoride toothpaste. Floss one time each day. Stay active. Exercise for at least 30 minutes 5 or more days each week. Do not use any products that contain nicotine or tobacco, such as cigarettes, e-cigarettes, and chewing tobacco. If you need help quitting, ask your health care provider. Do not use drugs. If you are sexually active, practice safe sex. Use a condom or other form of protection to prevent STIs (sexually transmitted infections). If you do not wish to become pregnant, use a form of birth control. If you plan to become pregnant, see your health care provider for a prepregnancy visit. If told by your health care provider, take low-dose aspirin daily starting at age 59. Find healthy ways to cope with stress, such as: Meditation, yoga, or listening to music. Journaling. Talking to a trusted person. Spending time with friends and family. Safety Always wear your seat belt while driving or riding in a vehicle. Do not drive: If you have been drinking alcohol. Do not ride with someone who has been drinking. When you are tired or distracted. While texting. Wear a helmet and other protective equipment during sports activities. If you have firearms in your house, make sure you follow all gun safety procedures. What's next? Visit your health care provider once a year for an annual wellness visit. Ask your health care provider how often you should have your eyes and teeth checked. Stay up to date on all vaccines. This information is not intended to replace advice given to you by your health care provider. Make sure you discuss any questions you have with your healthcare provider. Document Revised: 05/16/2020 Document Reviewed: 04/23/2018 Elsevier Patient Education  [place] Maintenance, Female Adopting a healthy lifestyle and getting preventive care are important in promoting health and wellness. Ask your health care  provider about: The right schedule for you to have regular tests and exams. Things you can do on your own to prevent diseases and keep yourself healthy. What should I know about diet, weight, and exercise? Eat a healthy diet  Eat a diet that includes plenty of vegetables, fruits, low-fat dairy products, and lean protein. Do not eat a lot of foods that are high in solid fats, added sugars, or sodium.  Maintain a healthy weight Body mass index (BMI) is used to identify weight problems. It estimates body fat based on height and weight. Your health care provider can help determineyour BMI and help you achieve or maintain a healthy weight. Get regular exercise Get regular exercise. This is one of the most important things you can do for your health. Most adults should: Exercise for at least 150 minutes each week. The exercise should increase your heart rate and make you sweat (moderate-intensity exercise). Do strengthening exercises at least twice a week. This is in addition to the moderate-intensity exercise. Spend less time sitting. Even light physical activity can be beneficial. Watch cholesterol and blood lipids Have your blood tested for lipids and cholesterol at 44 years of age, then havethis test every 5 years. Have your cholesterol levels checked more often if: Your lipid or cholesterol levels are high. You are older than 44 years  of age. You are at high risk for heart disease. What should I know about cancer screening? Depending on your health history and family history, you may need to have cancer screening at various ages. This may include screening for: Breast cancer. Cervical cancer. Colorectal cancer. Skin cancer. Lung cancer. What should I know about heart disease, diabetes, and high blood pressure? Blood pressure and heart disease High blood pressure causes heart disease and increases the risk of stroke. This is more likely to develop in people who have high blood pressure  readings, are of African descent, or are overweight. Have your blood pressure checked: Every 3-5 years if you are 29-37 years of age. Every year if you are 28 years old or older. Diabetes Have regular diabetes screenings. This checks your fasting blood sugar level. Have the screening done: Once every three years after age 39 if you are at a normal weight and have a low risk for diabetes. More often and at a younger age if you are overweight or have a high risk for diabetes. What should I know about preventing infection? Hepatitis B If you have a higher risk for hepatitis B, you should be screened for this virus. Talk with your health care provider to find out if you are at risk forhepatitis B infection. Hepatitis C Testing is recommended for: Everyone born from [birth year] through 1964. Anyone with known risk factors for hepatitis C. Sexually transmitted infections (STIs) Get screened for STIs, including gonorrhea and chlamydia, if: You are sexually active and are younger than 44 years of age. You are older than 44 years of age and your health care provider tells you that you are at risk for this type of infection. Your sexual activity has changed since you were last screened, and you are at increased risk for chlamydia or gonorrhea. Ask your health care provider if you are at risk. Ask your health care provider about whether you are at high risk for HIV. Your health care provider may recommend a prescription medicine to help prevent HIV infection. If you choose to take medicine to prevent HIV, you should first get tested for HIV. You should then be tested every 3 months for as long as you are taking the medicine. Pregnancy If you are about to stop having your period (premenopausal) and you may become pregnant, seek counseling before you get pregnant. Take 400 to 800 micrograms (mcg) of folic acid every day if you become pregnant. Ask for birth control (contraception) if you want to prevent  pregnancy. Osteoporosis and menopause Osteoporosis is a disease in which the bones lose minerals and strength with aging. This can result in bone fractures. If you are 69 years old or older, or if you are at risk for osteoporosis and fractures, ask your health care provider if you should: Be screened for bone loss. Take a calcium or vitamin D supplement to lower your risk of fractures. Be given hormone replacement therapy (HRT) to treat symptoms of menopause. Follow these instructions at home: Lifestyle Do not use any products that contain nicotine or tobacco, such as cigarettes, e-cigarettes, and chewing tobacco. If you need help quitting, ask your health care provider. Do not use street drugs. Do not share needles. Ask your health care provider for help if you need support or information about quitting drugs. Alcohol use Do not drink alcohol if: Your health care provider tells you not to drink. You are pregnant, may be pregnant, or are planning to become pregnant. If you drink  alcohol: Limit how much you use to 0-1 drink a day. Limit intake if you are breastfeeding. Be aware of how much alcohol is in your drink. In the [place] one drink equals one 12 oz bottle of beer (355 mL), one 5 oz glass of wine (148 mL), or one 1 oz glass of hard liquor (44 mL). General instructions Schedule regular health, dental, and eye exams. Stay current with your vaccines. Tell your health care provider if: You often feel depressed. You have ever been abused or do not feel safe at home. Summary Adopting a healthy lifestyle and getting preventive care are important in promoting health and wellness. Follow your health care provider's instructions about healthy diet, exercising, and getting tested or screened for diseases. Follow your health care provider's instructions on monitoring your cholesterol and blood pressure. This information is not intended to replace advice given to you by your health care  provider. Make sure you discuss any questions you have with your healthcare provider. Document Revised: 08/05/2018 Document Reviewed: 08/05/2018 Elsevier Patient Education  2022 [employer].

## 2021-02-14 ENCOUNTER — Other Ambulatory Visit: Payer: BC Managed Care – PPO

## 2021-02-14 DIAGNOSIS — Z0000 Encounter for general adult medical examination without abnormal findings: Secondary | ICD-10-CM | POA: Diagnosis not present

## 2021-02-14 DIAGNOSIS — Z6835 Body mass index (BMI) 35.0-35.9, adult: Secondary | ICD-10-CM

## 2021-02-15 LAB — HEMOGLOBIN A1C
Est. average glucose Bld gHb Est-mCnc: 117 mg/dL
Hgb A1c MFr Bld: 5.7 % — ABNORMAL HIGH (ref 4.8–5.6)

## 2021-02-15 LAB — CBC WITH DIFFERENTIAL/PLATELET
Basophils Absolute: 0.1 10*3/uL (ref 0.0–0.2)
Basos: 1 %
EOS (ABSOLUTE): 0.2 10*3/uL (ref 0.0–0.4)
Eos: 2 %
Hematocrit: 36.7 % (ref 34.0–46.6)
Hemoglobin: 11.4 g/dL (ref 11.1–15.9)
Immature Grans (Abs): 0 10*3/uL (ref 0.0–0.1)
Immature Granulocytes: 0 %
Lymphocytes Absolute: 2.7 10*3/uL (ref 0.7–3.1)
Lymphs: 35 %
MCH: 24.3 pg — ABNORMAL LOW (ref 26.6–33.0)
MCHC: 31.1 g/dL — ABNORMAL LOW (ref 31.5–35.7)
MCV: 78 fL — ABNORMAL LOW (ref 79–97)
Monocytes Absolute: 0.7 10*3/uL (ref 0.1–0.9)
Monocytes: 9 %
Neutrophils Absolute: 4 10*3/uL (ref 1.4–7.0)
Neutrophils: 53 %
Platelets: 373 10*3/uL (ref 150–450)
RBC: 4.69 x10E6/uL (ref 3.77–5.28)
RDW: 15.9 % — ABNORMAL HIGH (ref 11.7–15.4)
WBC: 7.6 10*3/uL (ref 3.4–10.8)

## 2021-02-15 LAB — COMPREHENSIVE METABOLIC PANEL
ALT: 25 IU/L (ref 0–32)
AST: 19 IU/L (ref 0–40)
Albumin/Globulin Ratio: 1.6 (ref 1.2–2.2)
Albumin: 4.2 g/dL (ref 3.8–4.8)
Alkaline Phosphatase: 84 IU/L (ref 44–121)
BUN/Creatinine Ratio: 13 (ref 9–23)
BUN: 11 mg/dL (ref 6–24)
Bilirubin Total: 0.3 mg/dL (ref 0.0–1.2)
CO2: 21 mmol/L (ref 20–29)
Calcium: 9.6 mg/dL (ref 8.7–10.2)
Chloride: 100 mmol/L (ref 96–106)
Creatinine, Ser: 0.85 mg/dL (ref 0.57–1.00)
Globulin, Total: 2.6 g/dL (ref 1.5–4.5)
Glucose: 82 mg/dL (ref 65–99)
Potassium: 4.5 mmol/L (ref 3.5–5.2)
Sodium: 139 mmol/L (ref 134–144)
Total Protein: 6.8 g/dL (ref 6.0–8.5)
eGFR: 87 mL/min/{1.73_m2} (ref >59)

## 2021-02-15 LAB — LIPID PANEL
Chol/HDL Ratio: 3.6 ratio (ref 0.0–4.4)
Cholesterol, Total: 196 mg/dL (ref 100–199)
HDL: 54 mg/dL (ref >39)
LDL Chol Calc (NIH): 103 mg/dL — ABNORMAL HIGH (ref 0–99)
Triglycerides: 229 mg/dL — ABNORMAL HIGH (ref 0–149)
VLDL Cholesterol Cal: 39 mg/dL (ref 5–40)

## 2021-02-15 LAB — VITAMIN D 25 HYDROXY (VIT D DEFICIENCY, FRACTURES): Vit D, 25-Hydroxy: 15.3 ng/mL — ABNORMAL LOW (ref 30.0–100.0)

## 2021-02-15 LAB — TSH: TSH: 0.873 u[IU]/mL (ref 0.450–4.500)

## 2021-02-15 LAB — CARDIOVASCULAR RISK ASSESSMENT

## 2021-02-23 ENCOUNTER — Other Ambulatory Visit: Payer: Self-pay | Admitting: Nurse Practitioner

## 2021-02-23 ENCOUNTER — Telehealth: Payer: Self-pay

## 2021-02-23 DIAGNOSIS — E559 Vitamin D deficiency, unspecified: Secondary | ICD-10-CM

## 2021-02-23 MED ORDER — ROSUVASTATIN CALCIUM 5 MG PO TABS: 5.0000 mg | ORAL_TABLET | Freq: Every day | ORAL | 3 refills | Status: DC

## 2021-02-23 MED ORDER — VITAMIN D (ERGOCALCIFEROL) 1.25 MG (50000 UNIT) PO CAPS: 50000.0000 [IU] | ORAL_CAPSULE | ORAL | 2 refills | Status: DC

## 2021-02-23 NOTE — Telephone Encounter (Signed)
Patient was informed. Can you please send a prescription to the pharmacy? [facility]

## 2021-03-21 ENCOUNTER — Other Ambulatory Visit: Payer: Self-pay

## 2021-03-21 DIAGNOSIS — E559 Vitamin D deficiency, unspecified: Secondary | ICD-10-CM

## 2021-03-21 MED ORDER — ROSUVASTATIN CALCIUM 5 MG PO TABS: 5.0000 mg | ORAL_TABLET | Freq: Every day | ORAL | 3 refills | Status: DC

## 2021-03-21 NOTE — Telephone Encounter (Signed)
Needs change in pharmacy.   Madison Caldwell, [place] 03/21/21 1:34 PM

## 2021-03-28 ENCOUNTER — Ambulatory Visit: Payer: BC Managed Care – PPO

## 2021-03-28 DIAGNOSIS — Z1159 Encounter for screening for other viral diseases: Secondary | ICD-10-CM

## 2021-03-28 NOTE — Progress Notes (Signed)
Pt arrived in parking lot for PCR test for travel. Pt will be called with results once have resulted.

## 2021-03-29 LAB — SARS-COV-2, NAA 2 DAY TAT

## 2021-03-29 LAB — NOVEL CORONAVIRUS, NAA: SARS-CoV-2, NAA: NOT DETECTED

## 2021-06-01 DIAGNOSIS — Z01419 Encounter for gynecological examination (general) (routine) without abnormal findings: Secondary | ICD-10-CM | POA: Diagnosis not present

## 2021-06-01 DIAGNOSIS — Z124 Encounter for screening for malignant neoplasm of cervix: Secondary | ICD-10-CM | POA: Diagnosis not present

## 2021-06-01 DIAGNOSIS — Z1231 Encounter for screening mammogram for malignant neoplasm of breast: Secondary | ICD-10-CM | POA: Diagnosis not present

## 2021-06-01 LAB — HM PAP SMEAR: HM Pap smear: NORMAL

## 2021-06-01 LAB — HM MAMMOGRAPHY

## 2021-07-30 DIAGNOSIS — M62830 Muscle spasm of back: Secondary | ICD-10-CM | POA: Diagnosis not present

## 2021-09-02 ENCOUNTER — Other Ambulatory Visit: Payer: Self-pay | Admitting: Family Medicine

## 2021-09-02 DIAGNOSIS — E782 Mixed hyperlipidemia: Secondary | ICD-10-CM

## 2021-09-02 DIAGNOSIS — E559 Vitamin D deficiency, unspecified: Secondary | ICD-10-CM

## 2021-09-02 DIAGNOSIS — R7303 Prediabetes: Secondary | ICD-10-CM

## 2021-09-03 ENCOUNTER — Other Ambulatory Visit: Payer: Self-pay

## 2021-09-03 ENCOUNTER — Ambulatory Visit: Payer: BC Managed Care – PPO

## 2021-09-03 DIAGNOSIS — E782 Mixed hyperlipidemia: Secondary | ICD-10-CM

## 2021-09-03 DIAGNOSIS — R7303 Prediabetes: Secondary | ICD-10-CM | POA: Diagnosis not present

## 2021-09-03 DIAGNOSIS — E559 Vitamin D deficiency, unspecified: Secondary | ICD-10-CM | POA: Diagnosis not present

## 2021-09-04 LAB — CBC WITH DIFFERENTIAL/PLATELET
Basophils Absolute: 0.1 10*3/uL (ref 0.0–0.2)
Basos: 1 %
EOS (ABSOLUTE): 0.1 10*3/uL (ref 0.0–0.4)
Eos: 2 %
Hematocrit: 39.8 % (ref 34.0–46.6)
Hemoglobin: 13 g/dL (ref 11.1–15.9)
Immature Grans (Abs): 0 10*3/uL (ref 0.0–0.1)
Immature Granulocytes: 0 %
Lymphocytes Absolute: 2.3 10*3/uL (ref 0.7–3.1)
Lymphs: 31 %
MCH: 26.7 pg (ref 26.6–33.0)
MCHC: 32.7 g/dL (ref 31.5–35.7)
MCV: 82 fL (ref 79–97)
Monocytes Absolute: 0.6 10*3/uL (ref 0.1–0.9)
Monocytes: 8 %
Neutrophils Absolute: 4.3 10*3/uL (ref 1.4–7.0)
Neutrophils: 58 %
Platelets: 372 10*3/uL (ref 150–450)
RBC: 4.86 x10E6/uL (ref 3.77–5.28)
RDW: 13.6 % (ref 11.7–15.4)
WBC: 7.3 10*3/uL (ref 3.4–10.8)

## 2021-09-04 LAB — HEMOGLOBIN A1C
Est. average glucose Bld gHb Est-mCnc: 114 mg/dL
Hgb A1c MFr Bld: 5.6 % (ref 4.8–5.6)

## 2021-09-04 LAB — LIPID PANEL
Chol/HDL Ratio: 3.2 ratio (ref 0.0–4.4)
Cholesterol, Total: 150 mg/dL (ref 100–199)
HDL: 47 mg/dL (ref >39)
LDL Chol Calc (NIH): 67 mg/dL (ref 0–99)
Triglycerides: 219 mg/dL — ABNORMAL HIGH (ref 0–149)
VLDL Cholesterol Cal: 36 mg/dL (ref 5–40)

## 2021-09-04 LAB — COMPREHENSIVE METABOLIC PANEL
ALT: 26 IU/L (ref 0–32)
AST: 18 IU/L (ref 0–40)
Albumin/Globulin Ratio: 1.7 (ref 1.2–2.2)
Albumin: 4.4 g/dL (ref 3.8–4.8)
Alkaline Phosphatase: 85 IU/L (ref 44–121)
BUN/Creatinine Ratio: 12 (ref 9–23)
BUN: 10 mg/dL (ref 6–24)
Bilirubin Total: 0.3 mg/dL (ref 0.0–1.2)
CO2: 23 mmol/L (ref 20–29)
Calcium: 9.6 mg/dL (ref 8.7–10.2)
Chloride: 103 mmol/L (ref 96–106)
Creatinine, Ser: 0.85 mg/dL (ref 0.57–1.00)
Globulin, Total: 2.6 g/dL (ref 1.5–4.5)
Glucose: 91 mg/dL (ref 70–99)
Potassium: 4.4 mmol/L (ref 3.5–5.2)
Sodium: 138 mmol/L (ref 134–144)
Total Protein: 7 g/dL (ref 6.0–8.5)
eGFR: 87 mL/min/{1.73_m2} (ref >59)

## 2021-09-04 LAB — VITAMIN D 25 HYDROXY (VIT D DEFICIENCY, FRACTURES): Vit D, 25-Hydroxy: 39.3 ng/mL (ref 30.0–100.0)

## 2021-09-04 LAB — CARDIOVASCULAR RISK ASSESSMENT

## 2021-09-07 ENCOUNTER — Encounter: Payer: Self-pay | Admitting: Nurse Practitioner

## 2021-09-07 ENCOUNTER — Other Ambulatory Visit: Payer: Self-pay

## 2021-09-07 ENCOUNTER — Ambulatory Visit: Payer: BC Managed Care – PPO | Admitting: Nurse Practitioner

## 2021-09-07 VITALS — BP 118/84 | HR 80 | Temp 97.3°F | Ht 67.5 in | Wt 223.0 lb

## 2021-09-07 DIAGNOSIS — Z23 Encounter for immunization: Secondary | ICD-10-CM | POA: Diagnosis not present

## 2021-09-07 DIAGNOSIS — K219 Gastro-esophageal reflux disease without esophagitis: Secondary | ICD-10-CM

## 2021-09-07 DIAGNOSIS — R7303 Prediabetes: Secondary | ICD-10-CM

## 2021-09-07 DIAGNOSIS — E782 Mixed hyperlipidemia: Secondary | ICD-10-CM | POA: Diagnosis not present

## 2021-09-07 DIAGNOSIS — E559 Vitamin D deficiency, unspecified: Secondary | ICD-10-CM | POA: Diagnosis not present

## 2021-09-07 MED ORDER — ROSUVASTATIN CALCIUM 5 MG PO TABS: 5.0000 mg | ORAL_TABLET | Freq: Every day | ORAL | 3 refills | Status: DC

## 2021-09-07 MED ORDER — VITAMIN D (ERGOCALCIFEROL) 1.25 MG (50000 UNIT) PO CAPS: 50000.0000 [IU] | ORAL_CAPSULE | ORAL | 2 refills | Status: DC

## 2021-09-07 NOTE — Progress Notes (Signed)
Subjective:  Patient ID: Madison Caldwell, female    DOB: 09-02-1975  Age: 46 y.o. MRN: [id number]  Chief Complaint  Patient presents with   Hyperlipidemia   Prediabetes   Vitamin d def    HPI  Madison Caldwell is a 46 year old Caucasian female that presents for follow-up of hyperlipidemia, vitamin D deficiency and GERD. She denies any acute medical problems today. She is up-to-date on screening mammogram, eye exam, and pap smear. She is up-to-date on seasonal flu vaccine. Due for Tdap, she will receive in-office today. She is adherent to follow-up appointments and medication regimen.   Lipid/Cholesterol, Follow-up  Last lipid panel Other pertinent labs  Lab Results  Component Value Date   CHOL 150 09/03/2021   HDL 47 09/03/2021   LDLCALC 67 09/03/2021   TRIG 219 (H) 09/03/2021   CHOLHDL 3.2 09/03/2021   Lab Results  Component Value Date   ALT 26 09/03/2021   AST 18 09/03/2021   PLT 372 09/03/2021   TSH 0.873 02/14/2021     She was last seen for this 3 months ago.  Management since that visit includes Crestor 5 mg.  She reports excellent compliance with treatment. She is not having side effects.   Symptoms: No chest pain No chest pressure/discomfort  No dyspnea No lower extremity edema  No numbness or tingling of extremity No orthopnea  No palpitations No paroxysmal nocturnal dyspnea  No speech difficulty No syncope   Current diet: in general, a "healthy" diet   Current exercise: none  The 10-year ASCVD risk score (Arnett DK, et al., 2019) is: 0.5% Vitamin D deficiency, follow-up  Lab Results  Component Value Date   VD25OH 39.3 09/03/2021   VD25OH 15.3 (L) 02/14/2021   CALCIUM 9.6 09/03/2021   CALCIUM 9.6 02/14/2021   Wt Readings from Last 3 Encounters:  09/07/21 223 lb (101.2 kg)  02/13/21 226 lb (102.5 kg)  05/28/17 185 lb (83.9 kg)    She was last seen for vitamin D deficiency 3 months ago.  Management since that visit includes Vitamin D 50, 000 U and Vitamin  D rich diet. She reports excellent compliance with treatment. She is not having side effects.   Symptoms: No change in energy level No numbness or tingling  No bone pain No unexplained fracture   GERD, Follow up:  The patient was last seen for GERD 3 months ago. Current treatment includes OTC GERD medication, avoiding foods that trigger GERD.  She reports excellent compliance with treatment. She is not having side effects. She is NOT experiencing belching and eructation  ---------------------------------------------------------------------------------------------------   Current Outpatient Medications on File Prior to Visit  Medication Sig Dispense Refill   fexofenadine (ALLEGRA) 180 MG tablet Take 180 mg by mouth daily.     Multiple Vitamin (MULTIVITAMIN) tablet Take 1 tablet by mouth daily.     rosuvastatin (CRESTOR) 5 MG tablet Take 1 tablet (5 mg total) by mouth daily. 90 tablet 3   Vitamin D, Ergocalciferol, (DRISDOL) 1.25 MG (50000 UNIT) CAPS capsule Take 1 capsule (50,000 Units total) by mouth every 7 (seven) days. 10 capsule 2   No current facility-administered medications on file prior to visit.       Social History   Socioeconomic History   Marital status: Married    Spouse name: Not on file   Number of children: Not on file   Years of education: Not on file   Highest education level: Not on file  Occupational History   Not on  file  Tobacco Use   Smoking status: Never   Smokeless tobacco: Never  Substance and Sexual Activity   Alcohol use: Not on file   Drug use: Not on file   Sexual activity: Not on file  Other Topics Concern   Not on file  Social History Narrative   Not on file   Social Determinants of Health   Financial Resource Strain: Not on file  Food Insecurity: Not on file  Transportation Needs: Not on file  Physical Activity: Not on file  Stress: Not on file  Social Connections: Not on file    Review of Systems  Constitutional:   Negative for chills, fatigue and fever.  HENT:  Negative for congestion, ear pain, rhinorrhea and sore throat.   Respiratory:  Negative for cough and shortness of breath.   Cardiovascular:  Negative for chest pain.  Gastrointestinal:  Negative for abdominal pain, constipation, diarrhea, nausea and vomiting.       Acid Reflux   Genitourinary:  Negative for dysuria and urgency.  Musculoskeletal:  Negative for back pain and myalgias.  Neurological:  Negative for dizziness, weakness, light-headedness and headaches.  Psychiatric/Behavioral:  Negative for dysphoric mood. The patient is not nervous/anxious.     Objective:  Pulse 80    Temp (!) 97.3 F (36.3 C)    Ht 5' 7.5" (1.715 m)    Wt 223 lb (101.2 kg)    SpO2 98%    BMI 34.41 kg/m  BP 118/84    Pulse 80    Temp (!) 97.3 F (36.3 C)    Ht 5' 7.5" (1.715 m)    Wt 223 lb (101.2 kg)    SpO2 98%    BMI 34.41 kg/m    BP/Weight 09/07/2021 02/13/2021 05/28/2017  Systolic BP - 124 107  Diastolic BP - 84 66  Wt. (Lbs) 223 226 185  BMI 34.41 35.4 28.98    Physical Exam Vitals reviewed.  Constitutional:      Appearance: Normal appearance.  HENT:     Head: Normocephalic.     Right Ear: Tympanic membrane normal.     Left Ear: Tympanic membrane normal.     Nose: Nose normal.     Mouth/Throat:     Mouth: Mucous membranes are moist.  Eyes:     Pupils: Pupils are equal, round, and reactive to light.  Cardiovascular:     Rate and Rhythm: Normal rate and regular rhythm.     Pulses: Normal pulses.     Heart sounds: Normal heart sounds.  Pulmonary:     Effort: Pulmonary effort is normal.     Breath sounds: Normal breath sounds.  Abdominal:     General: Bowel sounds are normal.     Palpations: Abdomen is soft.  Musculoskeletal:        General: Normal range of motion.     Cervical back: Neck supple.  Skin:    General: Skin is warm and dry.     Capillary Refill: Capillary refill takes less than 2 seconds.  Neurological:     General: No  focal deficit present.     Mental Status: She is alert and oriented to person, place, and time.  Psychiatric:        Mood and Affect: Mood normal.        Behavior: Behavior normal.        Lab Results  Component Value Date   WBC 7.3 09/03/2021   HGB 13.0 09/03/2021   HCT 39.8 09/03/2021  PLT 372 09/03/2021   GLUCOSE 91 09/03/2021   CHOL 150 09/03/2021   TRIG 219 (H) 09/03/2021   HDL 47 09/03/2021   LDLCALC 67 09/03/2021   ALT 26 09/03/2021   AST 18 09/03/2021   NA 138 09/03/2021   K 4.4 09/03/2021   CL 103 09/03/2021   CREATININE 0.85 09/03/2021   BUN 10 09/03/2021   CO2 23 09/03/2021   TSH 0.873 02/14/2021   HGBA1C 5.6 09/03/2021      Assessment & Plan:     1. Mixed hyperlipidemia-well controlled - rosuvastatin (CRESTOR) 5 MG tablet; Take 1 tablet (5 mg total) by mouth daily.  Dispense: 90 tablet; Refill: 3 -heart healthy diet -daily physical activity  2. Vitamin D deficiency-well controlled - Vitamin D, Ergocalciferol, (DRISDOL) 1.25 MG (50000 UNIT) CAPS capsule; Take 1 capsule (50,000 Units total) by mouth every 7 (seven) days.  Dispense: 10 capsule; Refill: 2 -continue Vit D rich diet  3. Gastroesophageal reflux disease, unspecified whether esophagitis present-well controlled -continue OTC GERD medication as directed -continue to avoid foods that trigger GERD -small frequent meals -avoid laying down for 3-4 hours after eating  4. Need for vaccination - Tdap vaccine greater than or equal to 7yo IM    Continue medications Follow-up in 1 year or sooner if needed     Follow-up: 1-year  An After Visit Summary was printed and given to the patient.  I, Janie Morning, NP, have reviewed all documentation for this visit. The documentation on 09/09/21 for the exam, diagnosis, procedures, and orders are all accurate and complete.     Signed, Janie Morning, NP [facility] [phone]

## 2021-09-07 NOTE — Patient Instructions (Addendum)
Continue medications Follow-up in 1 year or sooner if needed   Tdap (Tetanus, Diphtheria, Pertussis) Vaccine: What You Need to Know 1. Why get vaccinated? Tdap vaccine can prevent tetanus, diphtheria, and pertussis. Diphtheria and pertussis spread from person to person. Tetanus enters the body through cuts or wounds. TETANUS (T) causes painful stiffening of the muscles. Tetanus can lead to serious health problems, including being unable to open the mouth, having trouble swallowing and breathing, or death. DIPHTHERIA (D) can lead to difficulty breathing, heart failure, paralysis, or death. PERTUSSIS (aP), also known as "whooping cough," can cause uncontrollable, violent coughing that makes it hard to breathe, eat, or drink. Pertussis can be extremely serious especially in babies and young children, causing pneumonia, convulsions, brain damage, or death. In teens and adults, it can cause weight loss, loss of bladder control, passing out, and rib fractures from severe coughing. 2. Tdap vaccine Tdap is only for children 7 years and older, adolescents, and adults.  Adolescents should receive a single dose of Tdap, preferably at age 45 or 12 years. Pregnant people should get a dose of Tdap during every pregnancy, preferably during the early part of the third trimester, to help protect the newborn from pertussis. Infants are most at risk for severe, life-threatening complications from pertussis. Adults who have never received Tdap should get a dose of Tdap. Also, adults should receive a booster dose of either Tdap or Td (a different vaccine that protects against tetanus and diphtheria but not pertussis) every 10 years, or after 5 years in the case of a severe or dirty wound or burn. Tdap may be given at the same time as other vaccines. 3. Talk with your health care provider Tell your vaccine provider if the person getting the vaccine: Has had an allergic reaction after a previous dose of any vaccine  that protects against tetanus, diphtheria, or pertussis, or has any severe, life-threatening allergies Has had a coma, decreased level of consciousness, or prolonged seizures within 7 days after a previous dose of any pertussis vaccine (DTP, DTaP, or Tdap) Has seizures or another nervous system problem Has ever had Guillain-Barr Syndrome (also called "GBS") Has had severe pain or swelling after a previous dose of any vaccine that protects against tetanus or diphtheria In some cases, your health care provider may decide to postpone Tdap vaccination until a future visit. People with minor illnesses, such as a cold, may be vaccinated. People who are moderately or severely ill should usually wait until they recover before getting Tdap vaccine.  Your health care provider can give you more information. 4. Risks of a vaccine reaction Pain, redness, or swelling where the shot was given, mild fever, headache, feeling tired, and nausea, vomiting, diarrhea, or stomachache sometimes happen after Tdap vaccination. People sometimes faint after medical procedures, including vaccination. Tell your provider if you feel dizzy or have vision changes or ringing in the ears.  As with any medicine, there is a very remote chance of a vaccine causing a severe allergic reaction, other serious injury, or death. 5. What if there is a serious problem? An allergic reaction could occur after the vaccinated person leaves the clinic. If you see signs of a severe allergic reaction (hives, swelling of the face and throat, difficulty breathing, a fast heartbeat, dizziness, or weakness), call 9-1-1 and get the person to the nearest hospital. For other signs that concern you, call your health care provider.  Adverse reactions should be reported to the Vaccine Adverse Event Reporting System (  VAERS). Your health care provider will usually file this report, or you can do it yourself. Visit the VAERS website at www.vaers.LAgents.no or call  5015013150. VAERS is only for reporting reactions, and VAERS staff members do not give medical advice. 6. The National Vaccine Injury Compensation Program The [employer] Vaccine Injury Compensation Program (VICP) is a federal program that was created to compensate people who may have been injured by certain vaccines. Claims regarding alleged injury or death due to vaccination have a time limit for filing, which may be as short as two years. Visit the VICP website at SpiritualWord.at or call 814-276-4554 to learn about the program and about filing a claim. 7. How can I learn more? Ask your health care provider. Call your local or state health department. Visit the website of the Food and Drug Administration (FDA) for vaccine package inserts and additional information at FinderList.no. Contact the Centers for Disease Control and Prevention (CDC): Call 640-390-2230 (1-800-CDC-INFO) or Visit CDC's website at PicCapture.uy. Vaccine Information Statement Tdap (Tetanus, Diphtheria, Pertussis) Vaccine (03/31/2020) This information is not intended to replace advice given to you by your health care provider. Make sure you discuss any questions you have with your health care provider. Document Revised: 04/26/2020 Document Reviewed: 04/26/2020 Elsevier Patient Education  2022 Elsevier Inc.   Vitamin D Deficiency Vitamin D deficiency is when your body does not have enough vitamin D. Vitamin D is important to your body because: It helps your body use other minerals. It helps to keep your bones strong and healthy. It may help to prevent some diseases. It helps your heart and other muscles work well. Not getting enough vitamin D can make your bones soft. It can also cause other health problems. What are the causes? This condition may be caused by: Not eating enough foods that contain vitamin D. Not getting enough sun. Having diseases that make  it hard for your body to absorb vitamin D. Having a surgery in which a part of the stomach or a part of the small intestine is removed. Having kidney disease or liver disease. What increases the risk? You are more likely to get this condition if: You are older. You do not spend much time outdoors. You live in a nursing home. You have had broken bones. You have weak or thin bones (osteoporosis). You have a disease or condition that changes how your body absorbs vitamin D. You have dark skin. You take certain medicines. You are overweight or obese. What are the signs or symptoms? In mild cases, there may not be any symptoms. If the condition is very bad, symptoms may include: Bone pain. Muscle pain. Falling often. Broken bones caused by a minor injury. How is this treated? Treatment may include taking supplements as told by your doctor. Your doctor will tell you what dose is best for you. Supplements may include: Vitamin D. Calcium. Follow these instructions at home: Eating and drinking  Eat foods that contain vitamin D, such as: Dairy products, cereals, or juices with added vitamin D. Check the label. Fish, such as salmon or trout. Eggs. Oysters. Mushrooms. The items listed above may not be a complete list of what you can eat and drink. Contact a dietitian for more options. General instructions Take medicines and supplements only as told by your doctor. Get regular, safe exposure to natural sunlight. Do not use a tanning bed. Maintain a healthy weight. Lose weight if needed. Keep all follow-up visits as told by your doctor. This is important. How  is this prevented? You can get vitamin D by: Eating foods that naturally contain vitamin D. Eating or drinking products that have vitamin D added to them, such as cereals, juices, and milk. Taking vitamin D or a multivitamin that contains vitamin D. Being in the sun. Your body makes vitamin D when your skin is exposed to sunlight.  Your body changes the sunlight into a form of the vitamin that it can use. Contact a doctor if: Your symptoms do not go away. You feel sick to your stomach (nauseous). You throw up (vomit). You poop less often than normal, or you have trouble pooping (constipation). Summary Vitamin D deficiency is when your body does not have enough vitamin D. Vitamin D helps to keep your bones strong and healthy. This condition is often treated by taking a supplement. Your doctor will tell you what dose is best for you. This information is not intended to replace advice given to you by your health care provider. Make sure you discuss any questions you have with your health care provider. Document Revised: 04/20/2018 Document Reviewed: 04/20/2018 Elsevier Patient Education  2022 [employer]    Food Choices for Gastroesophageal Reflux Disease, Adult When you have gastroesophageal reflux disease (GERD), the foods you eat and your eating habits are very important. Choosing the right foods can help ease your discomfort. Think about working with a food expert (dietitian) to help you make good choices. What are tips for following this plan? Reading food labels Look for foods that are low in saturated fat. Foods that may help with your symptoms include: Foods that have less than 5% of daily value (DV) of fat. Foods that have 0 grams of trans fat. Cooking Do not fry your food. Cook your food by baking, steaming, grilling, or broiling. These are all methods that do not need a lot of fat for cooking. To add flavor, try to use herbs that are low in spice and acidity. Meal planning  Choose healthy foods that are low in fat, such as: Fruits and vegetables. Whole grains. Low-fat dairy products. Lean meats, fish, and poultry. Eat small meals often instead of eating 3 large meals each day. Eat your meals slowly in a place where you are relaxed. Avoid bending over or lying down until 2-3 hours after  eating. Limit high-fat foods such as fatty meats or fried foods. Limit your intake of fatty foods, such as oils, butter, and shortening. Avoid the following as told by your doctor: Foods that cause symptoms. These may be different for different people. Keep a food diary to keep track of foods that cause symptoms. Alcohol. Drinking a lot of liquid with meals. Eating meals during the 2-3 hours before bed. Lifestyle Stay at a healthy weight. Ask your doctor what weight is healthy for you. If you need to lose weight, work with your doctor to do so safely. Exercise for at least 30 minutes on 5 or more days each week, or as told by your doctor. Wear loose-fitting clothes. Do not smoke or use any products that contain nicotine or tobacco. If you need help quitting, ask your doctor. Sleep with the head of your bed higher than your feet. Use a wedge under the mattress or blocks under the bed frame to raise the head of the bed. Chew sugar-free gum after meals. What foods should eat? Eat a healthy, well-balanced diet of fruits, vegetables, whole grains, low-fat dairy products, lean meats, fish, and poultry. Each person is different. Foods that may  cause symptoms in one person may not cause any symptoms in another person. Work with your doctor to find foods that are safe for you. The items listed above may not be a complete list of what you can eat and drink. Contact a food expert for more options. What foods should I avoid? Limiting some of these foods may help in managing the symptoms of GERD. Everyone is different. Talk with a food expert or your doctor to help you find the exact foods to avoid, if any. Fruits Any fruits prepared with added fat. Any fruits that cause symptoms. For some people, this may include citrus fruits, such as oranges, grapefruit, pineapple, and lemons. Vegetables Deep-fried vegetables. [place] fries. Any vegetables prepared with added fat. Any vegetables that cause symptoms. For  some people, this may include tomatoes and tomato products, chili peppers, onions and garlic, and horseradish. Grains Pastries or quick breads with added fat. Meats and other proteins High-fat meats, such as fatty beef or pork, hot dogs, ribs, ham, sausage, salami, and bacon. Fried meat or protein, including fried fish and fried chicken. Nuts and nut butters, in large amounts. Dairy Whole milk and chocolate milk. Sour cream. Cream. Ice cream. Cream cheese. Milkshakes. Fats and oils Butter. Margarine. Shortening. Ghee. Beverages Coffee and tea, with or without caffeine. Carbonated beverages. Sodas. Energy drinks. Fruit juice made with acidic fruits, such as orange or grapefruit. Tomato juice. Alcoholic drinks. Sweets and desserts Chocolate and cocoa. Donuts. Seasonings and condiments Pepper. Peppermint and spearmint. Added salt. Any condiments, herbs, or seasonings that cause symptoms. For some people, this may include curry, hot sauce, or vinegar-based salad dressings. The items listed above may not be a complete list of what you should not eat and drink. Contact a food expert for more options. Questions to ask your doctor Diet and lifestyle changes are often the first steps that are taken to manage symptoms of GERD. If diet and lifestyle changes do not help, talk with your doctor about taking medicines. Where to find more information International Foundation for Gastrointestinal Disorders: aboutgerd.org Summary When you have GERD, food and lifestyle choices are very important in easing your symptoms. Eat small meals often instead of 3 large meals a day. Eat your meals slowly and in a place where you are relaxed. Avoid bending over or lying down until 2-3 hours after eating. Limit high-fat foods such as fatty meats or fried foods. This information is not intended to replace advice given to you by your health care provider. Make sure you discuss any questions you have with your health care  provider. Document Revised: 02/21/2020 Document Reviewed: 02/21/2020 Elsevier Patient Education  2022 [employer].

## 2021-10-21 DIAGNOSIS — J0110 Acute frontal sinusitis, unspecified: Secondary | ICD-10-CM | POA: Diagnosis not present

## 2022-07-16 DIAGNOSIS — Z01419 Encounter for gynecological examination (general) (routine) without abnormal findings: Secondary | ICD-10-CM | POA: Diagnosis not present

## 2022-07-16 DIAGNOSIS — Z1231 Encounter for screening mammogram for malignant neoplasm of breast: Secondary | ICD-10-CM | POA: Diagnosis not present

## 2022-07-22 ENCOUNTER — Other Ambulatory Visit: Payer: Self-pay | Admitting: Obstetrics and Gynecology

## 2022-07-22 DIAGNOSIS — R928 Other abnormal and inconclusive findings on diagnostic imaging of breast: Secondary | ICD-10-CM

## 2022-07-27 ENCOUNTER — Ambulatory Visit
Admission: RE | Admit: 2022-07-27 | Discharge: 2022-07-27 | Disposition: A | Discharge status: Home / Self Care | Payer: BC Managed Care – PPO | Source: Ambulatory Visit | Attending: Obstetrics and Gynecology | Admitting: Obstetrics and Gynecology

## 2022-07-27 ENCOUNTER — Other Ambulatory Visit: Payer: Self-pay | Admitting: Obstetrics and Gynecology

## 2022-07-27 DIAGNOSIS — R922 Inconclusive mammogram: Secondary | ICD-10-CM | POA: Diagnosis not present

## 2022-07-27 DIAGNOSIS — R928 Other abnormal and inconclusive findings on diagnostic imaging of breast: Secondary | ICD-10-CM

## 2022-08-06 ENCOUNTER — Ambulatory Visit
Admission: RE | Admit: 2022-08-06 | Discharge: 2022-08-06 | Disposition: A | Discharge status: Home / Self Care | Payer: BC Managed Care – PPO | Source: Ambulatory Visit | Attending: Obstetrics and Gynecology | Admitting: Obstetrics and Gynecology

## 2022-08-06 DIAGNOSIS — R928 Other abnormal and inconclusive findings on diagnostic imaging of breast: Secondary | ICD-10-CM

## 2022-08-06 DIAGNOSIS — N6312 Unspecified lump in the right breast, upper inner quadrant: Secondary | ICD-10-CM | POA: Diagnosis not present

## 2022-08-06 DIAGNOSIS — Z0389 Encounter for observation for other suspected diseases and conditions ruled out: Secondary | ICD-10-CM | POA: Diagnosis not present

## 2022-08-06 HISTORY — PX: BREAST BIOPSY: SHX20

## 2022-08-21 ENCOUNTER — Other Ambulatory Visit: Payer: BC Managed Care – PPO

## 2022-09-10 ENCOUNTER — Ambulatory Visit (INDEPENDENT_AMBULATORY_CARE_PROVIDER_SITE_OTHER): Payer: BC Managed Care – PPO | Admitting: Nurse Practitioner

## 2022-09-10 ENCOUNTER — Encounter: Payer: Self-pay | Admitting: Nurse Practitioner

## 2022-09-10 VITALS — BP 136/74 | HR 74 | Temp 97.6°F | Ht 67.0 in | Wt 229.0 lb

## 2022-09-10 DIAGNOSIS — Z6835 Body mass index (BMI) 35.0-35.9, adult: Secondary | ICD-10-CM | POA: Diagnosis not present

## 2022-09-10 DIAGNOSIS — Z1211 Encounter for screening for malignant neoplasm of colon: Secondary | ICD-10-CM | POA: Diagnosis not present

## 2022-09-10 DIAGNOSIS — Z0000 Encounter for general adult medical examination without abnormal findings: Secondary | ICD-10-CM | POA: Diagnosis not present

## 2022-09-10 DIAGNOSIS — Z808 Family history of malignant neoplasm of other organs or systems: Secondary | ICD-10-CM | POA: Diagnosis not present

## 2022-09-10 NOTE — Patient Instructions (Addendum)
We will call you with lab results and referral to Dr Jari Sportsman for colon cancer screening Recommend dermatology appt for head to toe skin assessment Follow-up in 1 year    Preventive Care 98-46 Years Old, Female Preventive care refers to lifestyle choices and visits with your health care provider that can promote health and wellness. Preventive care visits are also called wellness exams. What can I expect for my preventive care visit? Counseling Your health care provider may ask you questions about your: Medical history, including: Past medical problems. Family medical history. Pregnancy history. Current health, including: Menstrual cycle. Method of birth control. Emotional well-being. Home life and relationship well-being. Sexual activity and sexual health. Lifestyle, including: Alcohol, nicotine or tobacco, and drug use. Access to firearms. Diet, exercise, and sleep habits. Work and work Statistician. Sunscreen use. Safety issues such as seatbelt and bike helmet use. Physical exam Your health care provider will check your: Height and weight. These may be used to calculate your BMI (body mass index). BMI is a measurement that tells if you are at a healthy weight. Waist circumference. This measures the distance around your waistline. This measurement also tells if you are at a healthy weight and may help predict your risk of certain diseases, such as type 2 diabetes and high blood pressure. Heart rate and blood pressure. Body temperature. Skin for abnormal spots. What immunizations do I need?  Vaccines are usually given at various ages, according to a schedule. Your health care provider will recommend vaccines for you based on your age, medical history, and lifestyle or other factors, such as travel or where you work. What tests do I need? Screening Your health care provider may recommend screening tests for certain conditions. This may include: Lipid and cholesterol  levels. Diabetes screening. This is done by checking your blood sugar (glucose) after you have not eaten for a while (fasting). Pelvic exam and Pap test. Hepatitis B test. Hepatitis C test. HIV (human immunodeficiency virus) test. STI (sexually transmitted infection) testing, if you are at risk. Lung cancer screening. Colorectal cancer screening. Mammogram. Talk with your health care provider about when you should start having regular mammograms. This may depend on whether you have a family history of breast cancer. BRCA-related cancer screening. This may be done if you have a family history of breast, ovarian, tubal, or peritoneal cancers. Bone density scan. This is done to screen for osteoporosis. Talk with your health care provider about your test results, treatment options, and if necessary, the need for more tests. Follow these instructions at home: Eating and drinking  Eat a diet that includes fresh fruits and vegetables, whole grains, lean protein, and low-fat dairy products. Take vitamin and mineral supplements as recommended by your health care provider. Do not drink alcohol if: Your health care provider tells you not to drink. You are pregnant, may be pregnant, or are planning to become pregnant. If you drink alcohol: Limit how much you have to 0-1 drink a day. Know how much alcohol is in your drink. In the [place] one drink equals one 12 oz bottle of beer (355 mL), one 5 oz glass of wine (148 mL), or one 1 oz glass of hard liquor (44 mL). Lifestyle Brush your teeth every morning and night with fluoride toothpaste. Floss one time each day. Exercise for at least 30 minutes 5 or more days each week. Do not use any products that contain nicotine or tobacco. These products include cigarettes, chewing tobacco, and vaping devices, such as  you need help quitting, ask your health care provider. Do not use drugs. If you are sexually active, practice safe sex. Use a condom or other form of  protection to prevent STIs. If you do not wish to become pregnant, use a form of birth control. If you plan to become pregnant, see your health care provider for a prepregnancy visit. Take aspirin only as told by your health care provider. Make sure that you understand how much to take and what form to take. Work with your health care provider to find out whether it is safe and beneficial for you to take aspirin daily. Find healthy ways to manage stress, such as: Meditation, yoga, or listening to music. Journaling. Talking to a trusted person. Spending time with friends and family. Minimize exposure to UV radiation to reduce your risk of skin cancer. Safety Always wear your seat belt while driving or riding in a vehicle. Do not drive: If you have been drinking alcohol. Do not ride with someone who has been drinking. When you are tired or distracted. While texting. If you have been using any mind-altering substances or drugs. Wear a helmet and other protective equipment during sports activities. If you have firearms in your house, make sure you follow all gun safety procedures. Seek help if you have been physically or sexually abused. What's next? Visit your health care provider once a year for an annual wellness visit. Ask your health care provider how often you should have your eyes and teeth checked. Stay up to date on all vaccines. This information is not intended to replace advice given to you by your health care provider. Make sure you discuss any questions you have with your health care provider. Document Revised: 02/07/2021 Document Reviewed: 02/07/2021 Elsevier Patient Education  2023 Elsevier Inc.  

## 2022-09-10 NOTE — Progress Notes (Signed)
Subjective:  Patient ID: Madison Caldwell, female    DOB: August 18, 1976  Age: 47 y.o. MRN: [id number]  Chief Complaint  Patient presents with   Annual Exam    HPI Encounter for general adult medical examination without abnormal findings  Physical ("At Risk" items are starred): Patient's last physical exam was 1 year ago. Pt recently underwent right breast biopsy and clip placement, pathology revealed mass was benign, 07/2022. She is up-to-date with seasonal flu vaccine, eye exam. She has agreed to referral for colon cancer screening.      SDOH Screenings   Food Insecurity: No Food Insecurity (09/10/2022)  Housing: Low Risk  (09/10/2022)  Transportation Needs: No Transportation Needs (09/10/2022)  Utilities: Not At Risk (09/10/2022)  Alcohol Screen: Low Risk  (09/10/2022)  Depression (PHQ2-9): Low Risk  (09/10/2022)  Financial Resource Strain: Low Risk  (09/10/2022)  Physical Activity: Inactive (09/10/2022)  Social Connections: Moderately Integrated (09/10/2022)  Stress: No Stress Concern Present (09/10/2022)  Tobacco Use: Low Risk  (09/10/2022)       09/10/2022    9:02 AM 09/07/2021    8:02 AM 02/13/2021    2:08 PM  Fall Risk   Falls in the past year? 0 0 0  Number falls in past yr: 0 0 0  Injury with Fall? 0 0 0  Risk for fall due to : No Fall Risks No Fall Risks No Fall Risks  Follow up Falls evaluation completed Falls evaluation completed Falls evaluation completed       09/10/2022    9:03 AM 09/10/2022    9:02 AM 09/07/2021    8:02 AM 02/13/2021    2:08 PM  Depression screen PHQ 2/9  Decreased Interest 0 0 0 0  Down, Depressed, Hopeless 0 0 0 0  PHQ - 2 Score 0 0 0 0    Functional Status Survey: Is the patient deaf or have difficulty hearing?: No Does the patient have difficulty seeing, even when wearing glasses/contacts?: No Does the patient have difficulty concentrating, remembering, or making decisions?: No Does the patient have difficulty walking or climbing stairs?:  No Does the patient have difficulty dressing or bathing?: No Does the patient have difficulty doing errands alone such as visiting a doctor's office or shopping?: No   Safety: reviewed ;  Patient wears a seat belt. Patient's home has smoke detectors and carbon monoxide detectors. Patient practices appropriate gun safety Patient wears sunscreen with extended sun exposure. Dental Care: biannual cleanings, brushes and flosses daily. Ophthalmology/Optometry: Annual visit.  Hearing loss: none Vision impairments: none   Current Outpatient Medications on File Prior to Visit  Medication Sig Dispense Refill   ranitidine (ZANTAC) 75 MG tablet Take 75 mg by mouth 2 (two) times daily.     fexofenadine (ALLEGRA) 180 MG tablet Take 180 mg by mouth daily.     Multiple Vitamin (MULTIVITAMIN) tablet Take 1 tablet by mouth daily.     rosuvastatin (CRESTOR) 5 MG tablet Take 1 tablet (5 mg total) by mouth daily. 90 tablet 3   Vitamin D, Ergocalciferol, (DRISDOL) 1.25 MG (50000 UNIT) CAPS capsule Take 1 capsule (50,000 Units total) by mouth every 7 (seven) days. 10 capsule 2   No current facility-administered medications on file prior to visit.    Social Hx   Social History   Socioeconomic History   Marital status: Married    Spouse name: Not on file   Number of children: Not on file   Years of education: Not on file  Highest education level: Not on file  Occupational History   Not on file  Tobacco Use   Smoking status: Never   Smokeless tobacco: Never  Substance and Sexual Activity   Alcohol use: Not on file   Drug use: Not on file   Sexual activity: Not on file  Other Topics Concern   Not on file  Social History Narrative   Not on file   Social Determinants of Health   Financial Resource Strain: Low Risk  (09/10/2022)   Overall Financial Resource Strain (CARDIA)    Difficulty of Paying Living Expenses: Not hard at all  Food Insecurity: No Food Insecurity (09/10/2022)   Hunger Vital  Sign    Worried About Running Out of Food in the Last Year: Never true    Ran Out of Food in the Last Year: Never true  Transportation Needs: No Transportation Needs (09/10/2022)   PRAPARE - Administrator, Civil Service (Medical): No    Lack of Transportation (Non-Medical): No  Physical Activity: Inactive (09/10/2022)   Exercise Vital Sign    Days of Exercise per Week: 0 days    Minutes of Exercise per Session: 0 min  Stress: No Stress Concern Present (09/10/2022)   [employer] of Occupational Health - Occupational Stress Questionnaire    Feeling of Stress : Not at all  Social Connections: Moderately Integrated (09/10/2022)   Social Connection and Isolation Panel [NHANES]    Frequency of Communication with Friends and Family: More than three times a week    Frequency of Social Gatherings with Friends and Family: More than three times a week    Attends Religious Services: More than 4 times per year    Active Member of [employer] or Organizations: No    Attends Banker Meetings: Never    Marital Status: Married   History reviewed. No pertinent past medical history. History reviewed. No pertinent family history.  Review of Systems  Constitutional:  Negative for chills, fatigue and fever.  HENT:  Negative for congestion, ear pain, rhinorrhea and sore throat.   Respiratory:  Negative for cough and shortness of breath.   Cardiovascular:  Negative for chest pain.  Gastrointestinal:  Negative for abdominal pain, constipation, diarrhea, nausea and vomiting.  Genitourinary:  Negative for dysuria and urgency.  Musculoskeletal:  Negative for back pain and myalgias.  Skin:        Non-healing skin lesion left upper arm  Neurological:  Negative for dizziness, weakness, light-headedness and headaches.  Psychiatric/Behavioral:  Negative for dysphoric mood. The patient is not nervous/anxious.      Objective:  BP 136/74   Pulse 74   Temp 97.6 F (36.4 C)   Ht 5\' 7"   (1.702 m)   Wt 229 lb (103.9 kg)   LMP 09/02/2022   SpO2 100%   BMI 35.87 kg/m       09/10/2022    9:03 AM 09/07/2021    8:01 AM 02/13/2021    2:06 PM  BP/Weight  Systolic BP  118 02/15/2021  Diastolic BP  84 84  Wt. (Lbs) 229 223 226  BMI 35.87 kg/m2 34.41 kg/m2 35.4 kg/m2    Physical Exam Vitals reviewed.  Constitutional:      Appearance: Normal appearance.  HENT:     Head: Normocephalic.     Right Ear: Tympanic membrane normal.     Left Ear: Tympanic membrane normal.     Nose: Nose normal.     Mouth/Throat:  Mouth: Mucous membranes are moist.  Eyes:     Pupils: Pupils are equal, round, and reactive to light.  Cardiovascular:     Rate and Rhythm: Normal rate and regular rhythm.  Pulmonary:     Effort: Pulmonary effort is normal.     Breath sounds: Normal breath sounds.  Abdominal:     General: Bowel sounds are normal.     Palpations: Abdomen is soft.  Musculoskeletal:        General: Normal range of motion.     Cervical back: Neck supple.  Skin:    General: Skin is warm and dry.     Capillary Refill: Capillary refill takes less than 2 seconds.     Findings: Lesion present.          Comments: Raised non-healing skin lesion to left upper anterior arm/shoulder area  Neurological:     General: No focal deficit present.     Mental Status: She is alert and oriented to person, place, and time.  Psychiatric:        Mood and Affect: Mood normal.        Behavior: Behavior normal.     Lab Results  Component Value Date   WBC 7.3 09/03/2021   HGB 13.0 09/03/2021   HCT 39.8 09/03/2021   PLT 372 09/03/2021   GLUCOSE 91 09/03/2021   CHOL 150 09/03/2021   TRIG 219 (H) 09/03/2021   HDL 47 09/03/2021   LDLCALC 67 09/03/2021   ALT 26 09/03/2021   AST 18 09/03/2021   NA 138 09/03/2021   K 4.4 09/03/2021   CL 103 09/03/2021   CREATININE 0.85 09/03/2021   BUN 10 09/03/2021   CO2 23 09/03/2021   TSH 0.873 02/14/2021   HGBA1C 5.6 09/03/2021      Assessment &  Plan:     1. Routine medical exam - Ambulatory referral to Gastroenterology - CBC with Differential/Platelet - Comprehensive metabolic panel - Lipid panel - Iron, TIBC and Ferritin Panel - VITAMIN D 25 Hydroxy (Vit-D Deficiency, Fractures) - T4, free - TSH - B12 and Folate Panel  2. Encounter for screening for malignant neoplasm of colon - Ambulatory referral to Gastroenterology  3. BMI 35.0-35.9,adult - CBC with Differential/Platelet - Comprehensive metabolic panel - Lipid panel - Iron, TIBC and Ferritin Panel - VITAMIN D 25 Hydroxy (Vit-D Deficiency, Fractures) - T4, free - TSH - B12 and Folate Panel  4. Family history of melanoma  -recommend appt with dermatology dermatology  We will call you with lab results and referral to Dr Misenhiemer for colon cancer screening Recommend dermatology appt for head to toe skin assessment Follow-up in 1 year   These are the goals we discussed:  Goals   Remain healthy       This is a list of the screening recommended for you and due dates:  Health Maintenance  Topic Date Due   Colon Cancer Screening  Never done   Pap Smear  06/01/2024   DTaP/Tdap/Td vaccine (2 - Td or Tdap) 09/08/2031   Flu Shot  Completed   HPV Vaccine  Aged Out   COVID-19 Vaccine  Discontinued   Hepatitis C Screening: USPSTF Recommendation to screen - Ages 18-79 yo.  Discontinued   HIV Screening  Discontinued      AN INDIVIDUALIZED CARE PLAN: was established or reinforced today.   SELF MANAGEMENT: The patient and I together assessed ways to personally work towards obtaining the recommended goals  Support needs The patient and/or family  needs were assessed and services were offered and not necessary at this time.    Follow-up: 1-year  I, Rip Harbour, NP, have reviewed all documentation for this visit. The documentation on 09/10/22 for the exam, diagnosis, procedures, and orders are all accurate and complete.   Signed, Jerrell Belfast, [place] [phone]

## 2022-09-11 LAB — CBC WITH DIFFERENTIAL/PLATELET
Basophils Absolute: 0.1 10*3/uL (ref 0.0–0.2)
Basos: 1 %
EOS (ABSOLUTE): 0.2 10*3/uL (ref 0.0–0.4)
Eos: 3 %
Hematocrit: 38.9 % (ref 34.0–46.6)
Hemoglobin: 12.7 g/dL (ref 11.1–15.9)
Immature Grans (Abs): 0 10*3/uL (ref 0.0–0.1)
Immature Granulocytes: 0 %
Lymphocytes Absolute: 2.5 10*3/uL (ref 0.7–3.1)
Lymphs: 31 %
MCH: 27.4 pg (ref 26.6–33.0)
MCHC: 32.6 g/dL (ref 31.5–35.7)
MCV: 84 fL (ref 79–97)
Monocytes Absolute: 0.6 10*3/uL (ref 0.1–0.9)
Monocytes: 7 %
Neutrophils Absolute: 4.8 10*3/uL (ref 1.4–7.0)
Neutrophils: 58 %
Platelets: 368 10*3/uL (ref 150–450)
RBC: 4.63 x10E6/uL (ref 3.77–5.28)
RDW: 13.5 % (ref 11.7–15.4)
WBC: 8.1 10*3/uL (ref 3.4–10.8)

## 2022-09-11 LAB — COMPREHENSIVE METABOLIC PANEL
ALT: 35 IU/L — ABNORMAL HIGH (ref 0–32)
AST: 23 IU/L (ref 0–40)
Albumin/Globulin Ratio: 1.7 (ref 1.2–2.2)
Albumin: 4.3 g/dL (ref 3.9–4.9)
Alkaline Phosphatase: 90 IU/L (ref 44–121)
BUN/Creatinine Ratio: 12 (ref 9–23)
BUN: 9 mg/dL (ref 6–24)
Bilirubin Total: 0.3 mg/dL (ref 0.0–1.2)
CO2: 21 mmol/L (ref 20–29)
Calcium: 9.7 mg/dL (ref 8.7–10.2)
Chloride: 102 mmol/L (ref 96–106)
Creatinine, Ser: 0.74 mg/dL (ref 0.57–1.00)
Globulin, Total: 2.6 g/dL (ref 1.5–4.5)
Glucose: 79 mg/dL (ref 70–99)
Potassium: 4.6 mmol/L (ref 3.5–5.2)
Sodium: 138 mmol/L (ref 134–144)
Total Protein: 6.9 g/dL (ref 6.0–8.5)
eGFR: 102 mL/min/{1.73_m2} (ref >59)

## 2022-09-11 LAB — LIPID PANEL
Chol/HDL Ratio: 3.5 ratio (ref 0.0–4.4)
Cholesterol, Total: 173 mg/dL (ref 100–199)
HDL: 49 mg/dL (ref >39)
LDL Chol Calc (NIH): 79 mg/dL (ref 0–99)
Triglycerides: 277 mg/dL — ABNORMAL HIGH (ref 0–149)
VLDL Cholesterol Cal: 45 mg/dL — ABNORMAL HIGH (ref 5–40)

## 2022-09-11 LAB — T4, FREE: Free T4: 0.93 ng/dL (ref 0.82–1.77)

## 2022-09-11 LAB — IRON,TIBC AND FERRITIN PANEL
Ferritin: 15 ng/mL (ref 15–150)
Iron Saturation: 22 % (ref 15–55)
Iron: 90 ug/dL (ref 27–159)
Total Iron Binding Capacity: 406 ug/dL (ref 250–450)
UIBC: 316 ug/dL (ref 131–425)

## 2022-09-11 LAB — CARDIOVASCULAR RISK ASSESSMENT

## 2022-09-11 LAB — VITAMIN D 25 HYDROXY (VIT D DEFICIENCY, FRACTURES): Vit D, 25-Hydroxy: 21.5 ng/mL — ABNORMAL LOW (ref 30.0–100.0)

## 2022-09-11 LAB — B12 AND FOLATE PANEL
Folate: >20 ng/mL (ref >3.0)
Vitamin B-12: 652 pg/mL (ref 232–1245)

## 2022-09-11 LAB — TSH: TSH: 0.817 u[IU]/mL (ref 0.450–4.500)

## 2022-09-12 ENCOUNTER — Other Ambulatory Visit: Payer: Self-pay

## 2022-09-12 DIAGNOSIS — E559 Vitamin D deficiency, unspecified: Secondary | ICD-10-CM

## 2022-09-12 MED ORDER — VITAMIN D (ERGOCALCIFEROL) 1.25 MG (50000 UNIT) PO CAPS: 50000.0000 [IU] | ORAL_CAPSULE | ORAL | 2 refills | Status: DC

## 2022-11-11 DIAGNOSIS — R1013 Epigastric pain: Secondary | ICD-10-CM | POA: Diagnosis not present

## 2022-11-11 DIAGNOSIS — Z01818 Encounter for other preprocedural examination: Secondary | ICD-10-CM | POA: Diagnosis not present

## 2022-11-15 ENCOUNTER — Other Ambulatory Visit: Payer: Self-pay

## 2022-11-15 DIAGNOSIS — E782 Mixed hyperlipidemia: Secondary | ICD-10-CM

## 2022-11-15 MED ORDER — ROSUVASTATIN CALCIUM 5 MG PO TABS: 5.0000 mg | ORAL_TABLET | Freq: Every day | ORAL | 0 refills | Status: DC

## 2022-12-03 DIAGNOSIS — K449 Diaphragmatic hernia without obstruction or gangrene: Secondary | ICD-10-CM | POA: Diagnosis not present

## 2022-12-03 DIAGNOSIS — R12 Heartburn: Secondary | ICD-10-CM | POA: Diagnosis not present

## 2022-12-03 DIAGNOSIS — K648 Other hemorrhoids: Secondary | ICD-10-CM | POA: Diagnosis not present

## 2022-12-03 DIAGNOSIS — R0789 Other chest pain: Secondary | ICD-10-CM | POA: Diagnosis not present

## 2022-12-03 DIAGNOSIS — Z1211 Encounter for screening for malignant neoplasm of colon: Secondary | ICD-10-CM | POA: Diagnosis not present

## 2022-12-03 DIAGNOSIS — K644 Residual hemorrhoidal skin tags: Secondary | ICD-10-CM | POA: Diagnosis not present

## 2022-12-03 DIAGNOSIS — R1013 Epigastric pain: Secondary | ICD-10-CM | POA: Diagnosis not present

## 2022-12-03 DIAGNOSIS — R079 Chest pain, unspecified: Secondary | ICD-10-CM | POA: Diagnosis not present

## 2023-01-23 DIAGNOSIS — K644 Residual hemorrhoidal skin tags: Secondary | ICD-10-CM | POA: Diagnosis not present

## 2023-01-23 DIAGNOSIS — R1013 Epigastric pain: Secondary | ICD-10-CM | POA: Diagnosis not present

## 2023-02-13 ENCOUNTER — Other Ambulatory Visit: Payer: Self-pay | Admitting: Family Medicine

## 2023-02-13 DIAGNOSIS — E782 Mixed hyperlipidemia: Secondary | ICD-10-CM

## 2023-02-18 ENCOUNTER — Other Ambulatory Visit: Payer: Self-pay

## 2023-02-18 DIAGNOSIS — E782 Mixed hyperlipidemia: Secondary | ICD-10-CM

## 2023-02-18 MED ORDER — ROSUVASTATIN CALCIUM 5 MG PO TABS: 5.0000 mg | ORAL_TABLET | Freq: Every day | ORAL | 0 refills | Status: DC

## 2023-03-18 DIAGNOSIS — L820 Inflamed seborrheic keratosis: Secondary | ICD-10-CM | POA: Diagnosis not present

## 2023-03-18 DIAGNOSIS — L918 Other hypertrophic disorders of the skin: Secondary | ICD-10-CM | POA: Diagnosis not present

## 2023-03-18 DIAGNOSIS — L720 Epidermal cyst: Secondary | ICD-10-CM | POA: Diagnosis not present

## 2023-05-27 DIAGNOSIS — Z6834 Body mass index (BMI) 34.0-34.9, adult: Secondary | ICD-10-CM | POA: Diagnosis not present

## 2023-05-27 DIAGNOSIS — Z309 Encounter for contraceptive management, unspecified: Secondary | ICD-10-CM | POA: Diagnosis not present

## 2023-05-27 DIAGNOSIS — N921 Excessive and frequent menstruation with irregular cycle: Secondary | ICD-10-CM | POA: Diagnosis not present

## 2023-06-16 ENCOUNTER — Other Ambulatory Visit (HOSPITAL_BASED_OUTPATIENT_CLINIC_OR_DEPARTMENT_OTHER): Payer: Self-pay | Admitting: Obstetrics and Gynecology

## 2023-06-16 DIAGNOSIS — R102 Pelvic and perineal pain: Secondary | ICD-10-CM

## 2023-06-17 ENCOUNTER — Ambulatory Visit (HOSPITAL_BASED_OUTPATIENT_CLINIC_OR_DEPARTMENT_OTHER)
Admission: RE | Admit: 2023-06-17 | Discharge: 2023-06-17 | Disposition: A | Discharge status: Home / Self Care | Payer: BC Managed Care – PPO | Source: Ambulatory Visit | Attending: Obstetrics and Gynecology | Admitting: Obstetrics and Gynecology

## 2023-06-17 DIAGNOSIS — R102 Pelvic and perineal pain: Secondary | ICD-10-CM | POA: Diagnosis not present

## 2023-06-26 DIAGNOSIS — N8003 Adenomyosis of the uterus: Secondary | ICD-10-CM | POA: Diagnosis not present

## 2023-07-31 DIAGNOSIS — Z1231 Encounter for screening mammogram for malignant neoplasm of breast: Secondary | ICD-10-CM | POA: Diagnosis not present

## 2023-07-31 DIAGNOSIS — Z01419 Encounter for gynecological examination (general) (routine) without abnormal findings: Secondary | ICD-10-CM | POA: Diagnosis not present

## 2023-08-12 ENCOUNTER — Other Ambulatory Visit: Payer: Self-pay | Admitting: Physician Assistant

## 2023-08-12 DIAGNOSIS — E782 Mixed hyperlipidemia: Secondary | ICD-10-CM

## 2023-09-12 ENCOUNTER — Encounter: Payer: BC Managed Care – PPO | Admitting: Physician Assistant

## 2023-09-12 ENCOUNTER — Encounter: Payer: Self-pay | Admitting: Family Medicine

## 2023-09-12 ENCOUNTER — Ambulatory Visit (INDEPENDENT_AMBULATORY_CARE_PROVIDER_SITE_OTHER): Payer: BC Managed Care – PPO | Admitting: Family Medicine

## 2023-09-12 VITALS — BP 134/78 | HR 75 | Temp 97.8°F | Ht 67.0 in | Wt 215.0 lb

## 2023-09-12 DIAGNOSIS — N8003 Adenomyosis of the uterus: Secondary | ICD-10-CM | POA: Diagnosis not present

## 2023-09-12 DIAGNOSIS — J019 Acute sinusitis, unspecified: Secondary | ICD-10-CM | POA: Insufficient documentation

## 2023-09-12 DIAGNOSIS — E559 Vitamin D deficiency, unspecified: Secondary | ICD-10-CM | POA: Diagnosis not present

## 2023-09-12 DIAGNOSIS — Z0000 Encounter for general adult medical examination without abnormal findings: Secondary | ICD-10-CM | POA: Insufficient documentation

## 2023-09-12 DIAGNOSIS — N939 Abnormal uterine and vaginal bleeding, unspecified: Secondary | ICD-10-CM

## 2023-09-12 HISTORY — DX: Adenomyosis of the uterus: N80.03

## 2023-09-12 HISTORY — DX: Acute sinusitis, unspecified: J01.90

## 2023-09-12 HISTORY — DX: Encounter for general adult medical examination without abnormal findings: Z00.00

## 2023-09-12 HISTORY — DX: Vitamin D deficiency, unspecified: E55.9

## 2023-09-12 MED ORDER — FLUCONAZOLE 150 MG PO TABS: 150.0000 mg | ORAL_TABLET | Freq: Every day | ORAL | 0 refills | Status: AC

## 2023-09-12 MED ORDER — AMOXICILLIN 875 MG PO TABS: 875.0000 mg | ORAL_TABLET | Freq: Two times a day (BID) | ORAL | 0 refills | Status: AC

## 2023-09-12 NOTE — Progress Notes (Signed)
Subjective:  Patient ID: Madison Caldwell, female    DOB: 10/03/1975  Age: 48 y.o. MRN: [id number]  Chief Complaint  Patient presents with   Annual Exam    Discussed the use of AI scribe software for clinical note transcription with the patient, who gave verbal consent to proceed.    HPI  Well Adult Physical: Patient here for a comprehensive physical exam.The patient reports Sinus symptoms x1 week. Denies any fever, body aches, chills, st. Do you take any herbs or supplements that were not prescribed by a doctor? no Are you taking calcium supplements? no Are you taking aspirin daily? no  Encounter for general adult medical examination without abnormal findings  Physical ("At Risk" items are starred): Patient's last physical exam was 1 year ago .  Patient is not afflicted from Stress Incontinence and Urge Incontinence  Patient wears a seat belts Patient has smoke detectors and has carbon monoxide detectors. Patient practices appropriate gun safety. Patient wears sunscreen with extended sun exposure. Dental Care: biannual cleanings, brushes and flosses daily. Ophthalmology/Optometry: Annual visit.  Hearing loss: none Vision impairments: none  Menarche: 15 Menstrual History: regular with Hailey LMP: 08/28/2023 Pregnancy history: 2 children (16 year twins) Safe at home: Yes Self breast exams: Yes  The patient presents with a week-long history of sinus congestion, which she has been self-managing with Advil Allergy and Congestion and Mucinex. She reports no fever, but has experienced exhaustion due to difficulty breathing. She has not noticed any colored mucus and denies pain in the sinus areas. She has a history of similar episodes, typically once a year.  In addition to the sinus congestion, the patient has been diagnosed with uterine adenomyosis, which has been causing heavy and painful periods. She is currently managing this with birth control and ibuprofen. She has chosen not to  use an IUD at this time as the current management is working for her.  The patient also reports a previous concern with low thyroid levels, but tests came back normal. She has a history of low vitamin D levels and would like this rechecked, especially given her current fatigue. She also requests an iron panel due to her ongoing uterine adenomyosis.     09/12/2023   11:00 AM 09/10/2022    9:03 AM 09/10/2022    9:02 AM 09/07/2021    8:02 AM 02/13/2021    2:08 PM  Depression screen PHQ 2/9  Decreased Interest 0 0 0 0 0  Down, Depressed, Hopeless 0 0 0 0 0  PHQ - 2 Score 0 0 0 0 0  Altered sleeping 0      Tired, decreased energy 0      Change in appetite 0      Feeling bad or failure about yourself  0      Trouble concentrating 0      Moving slowly or fidgety/restless 0      Suicidal thoughts 0      PHQ-9 Score 0      Difficult doing work/chores Not difficult at all             02/13/2021    2:08 PM 09/07/2021    8:02 AM 09/10/2022    9:02 AM 09/12/2023   11:00 AM  Fall Risk  Falls in the past year? 0 0 0 0  Was there an injury with Fall? 0 0 0 0  Fall Risk Category Calculator 0 0 0 0  Fall Risk Category (Retired) Low Low    (  RETIRED) Patient Fall Risk Level Low fall risk Low fall risk    Patient at Risk for Falls Due to No Fall Risks No Fall Risks No Fall Risks No Fall Risks  Fall risk Follow up Falls evaluation completed Falls evaluation completed Falls evaluation completed Falls evaluation completed            Social Hx   Social History   Socioeconomic History   Marital status: Married    Spouse name: Not on file   Number of children: Not on file   Years of education: Not on file   Highest education level: Not on file  Occupational History   Not on file  Tobacco Use   Smoking status: Never   Smokeless tobacco: Never  Substance and Sexual Activity   Alcohol use: Not Currently   Drug use: Never   Sexual activity: Not on file  Other Topics Concern   Not on file   Social History Narrative   Not on file   Social Drivers of Health   Financial Resource Strain: Low Risk  (09/12/2023)   Overall Financial Resource Strain (CARDIA)    Difficulty of Paying Living Expenses: Not hard at all  Food Insecurity: No Food Insecurity (09/12/2023)   Hunger Vital Sign    Worried About Running Out of Food in the Last Year: Never true    Ran Out of Food in the Last Year: Never true  Transportation Needs: No Transportation Needs (09/12/2023)   PRAPARE - Administrator, Civil Service (Medical): No    Lack of Transportation (Non-Medical): No  Physical Activity: Inactive (09/12/2023)   Exercise Vital Sign    Days of Exercise per Week: 0 days    Minutes of Exercise per Session: 0 min  Stress: No Stress Concern Present (09/12/2023)   [employer] of Occupational Health - Occupational Stress Questionnaire    Feeling of Stress : Not at all  Social Connections: Moderately Integrated (09/12/2023)   Social Connection and Isolation Panel [NHANES]    Frequency of Communication with Friends and Family: More than three times a week    Frequency of Social Gatherings with Friends and Family: More than three times a week    Attends Religious Services: More than 4 times per year    Active Member of [employer] or Organizations: No    Attends Banker Meetings: Never    Marital Status: Married   History reviewed. No pertinent past medical history. Past Surgical History:  Procedure Laterality Date   BREAST BIOPSY Right 08/06/2022   BREAST BIOPSY Right 08/06/2022   [place] RT BREAST BX W LOC DEV 1ST LESION IMG BX SPEC US GUIDE 08/06/2022 [facility]    History reviewed. No pertinent family history.  Review of Systems  Constitutional:  Negative for appetite change, fatigue and fever.  HENT:  Negative for congestion, ear pain, sinus pressure and sore throat.   Respiratory:  Negative for cough, chest tightness, shortness of breath and wheezing.    Cardiovascular:  Negative for chest pain and palpitations.  Gastrointestinal:  Negative for abdominal pain, constipation, diarrhea, nausea and vomiting.  Genitourinary:  Negative for dysuria and hematuria.  Musculoskeletal:  Negative for arthralgias, back pain, joint swelling and myalgias.  Skin:  Negative for rash.  Neurological:  Negative for dizziness, weakness and headaches.  Psychiatric/Behavioral:  Negative for dysphoric mood. The patient is not nervous/anxious.      Objective:  BP 134/78 (BP Location: Left Arm, Patient Position: Sitting)  Pulse 75   Temp 97.8 F (36.6 C) (Temporal)   Ht 5\' 7"  (1.702 m)   Wt 215 lb (97.5 kg)   LMP 08/28/2023   SpO2 98%   BMI 33.67 kg/m      09/12/2023   10:54 AM 09/10/2022    9:03 AM 09/07/2021    8:01 AM  BP/Weight  Systolic BP 134 136 118  Diastolic BP 78 74 84  Wt. (Lbs) 215 229 223  BMI 33.67 kg/m2 35.87 kg/m2 34.41 kg/m2    Physical Exam Vitals reviewed.  Constitutional:      General: She is not in acute distress.    Appearance: Normal appearance. She is well-groomed. She is obese.  HENT:     Head: Normocephalic.     Right Ear: Ear canal and external ear normal. A middle ear effusion is present.     Left Ear: Tympanic membrane, ear canal and external ear normal. No tenderness.     Nose:     Right Turbinates: Pale.     Left Turbinates: Pale.     Right Sinus: No maxillary sinus tenderness or frontal sinus tenderness.     Left Sinus: No maxillary sinus tenderness or frontal sinus tenderness.     Mouth/Throat:     Mouth: Mucous membranes are moist.     Pharynx: Oropharynx is clear. No posterior oropharyngeal erythema.  Eyes:     Extraocular Movements: Extraocular movements intact.     Conjunctiva/sclera: Conjunctivae normal.     Pupils: Pupils are equal, round, and reactive to light.  Cardiovascular:     Rate and Rhythm: Normal rate and regular rhythm.     Pulses: Normal pulses.     Heart sounds: Normal heart sounds.  No murmur heard. Pulmonary:     Effort: Pulmonary effort is normal.     Breath sounds: Normal breath sounds. No wheezing.  Abdominal:     General: Bowel sounds are normal.     Palpations: Abdomen is soft.     Tenderness: There is no abdominal tenderness.  Musculoskeletal:        General: Normal range of motion.     Cervical back: Normal range of motion and neck supple.  Skin:    General: Skin is warm and dry.  Neurological:     Mental Status: She is alert and oriented to person, place, and time. Mental status is at baseline.     Cranial Nerves: Cranial nerves 2-12 are intact.     Sensory: Sensation is intact.     Motor: Motor function is intact.     Coordination: Coordination is intact.     Gait: Gait is intact.     Deep Tendon Reflexes: Reflexes are normal and symmetric.  Psychiatric:        Mood and Affect: Mood normal.        Behavior: Behavior normal.        Thought Content: Thought content normal.        Judgment: Judgment normal.     Lab Results  Component Value Date   WBC 8.1 09/10/2022   HGB 12.7 09/10/2022   HCT 38.9 09/10/2022   PLT 368 09/10/2022   GLUCOSE 79 09/10/2022   CHOL 173 09/10/2022   TRIG 277 (H) 09/10/2022   HDL 49 09/10/2022   LDLCALC 79 09/10/2022   ALT 35 (H) 09/10/2022   AST 23 09/10/2022   NA 138 09/10/2022   K 4.6 09/10/2022   CL 102 09/10/2022   CREATININE 0.74 09/10/2022  BUN 9 09/10/2022   CO2 21 09/10/2022   TSH 0.817 09/10/2022   HGBA1C 5.6 09/03/2021      Assessment & Plan:  Encounter for general adult medical examination without abnormal findings -     CBC with Differential/Platelet -     Comprehensive metabolic panel -     Lipid panel -     TSH  Vitamin D deficiency Assessment & Plan: Reports feeling exhausted, possibly due to sinusitis or low vitamin D and iron levels. -Order labs to check Vitamin D   Orders: -     VITAMIN D 25 Hydroxy (Vit-D Deficiency, Fractures)  Acute sinusitis with symptoms > 10  days Assessment & Plan: Complaints of sinus congestion and pressure for a week. No fever or severe symptoms. Currently taking Zyrtec and Advil Allergy and Congestion. No antibiotic allergies, but has a sulfur allergy. -Prescribe Amoxicillin for 7 days. -Continue probiotics with antibiotics to prevent GI upset. -Order Fluconazole as a precaution for potential yeast infection.  Orders: -     Amoxicillin; Take 1 tablet (875 mg total) by mouth 2 (two) times daily for 7 days.  Dispense: 14 tablet; Refill: 0 -     Fluconazole; Take 1 tablet (150 mg total) by mouth daily for 1 dose.  Dispense: 1 tablet; Refill: 0  Abnormal uterine bleeding due to adenomyosis Assessment & Plan: Reports heavy, painful periods. Currently managed with birth control and ibuprofen. -Continue current management plan under your gynecologist - labs drawn, Await labs/testing for assessment and recommendations   Orders: -     Iron, TIBC and Ferritin Panel     Body mass index is 33.67 kg/m.   These are the goals we discussed:  Goals      DIET - EAT MORE FRUITS AND VEGETABLES     Eat healthy.     Mediterranean Diet  Why follow it? Research shows. Those who follow the Mediterranean diet have a reduced risk of heart disease  The diet is associated with a reduced incidence of Parkinson's and Alzheimer's diseases People following the diet may have longer life expectancies and lower rates of chronic diseases  The Dietary Guidelines for Americans recommends the Mediterranean diet as an eating plan to promote health and prevent disease  What Is the Mediterranean Diet?  Healthy eating plan based on typical foods and recipes of Mediterranean-style cooking The diet is primarily a plant based diet; these foods should make up a majority of meals   Starches - Plant based foods should make up a majority of meals - They are an important sources of vitamins, minerals, energy, antioxidants, and fiber - Choose whole grains,  foods high in fiber and minimally processed items  - Typical grain sources include wheat, oats, barley, corn, brown rice, bulgar, farro, millet, polenta, couscous  - Various types of beans include chickpeas, lentils, fava beans, black beans, white beans   Fruits  Veggies - Large quantities of antioxidant rich fruits & veggies; 6 or more servings  - Vegetables can be eaten raw or lightly drizzled with oil and cooked  - Vegetables common to the traditional Mediterranean Diet include: artichokes, arugula, beets, broccoli, brussel sprouts, cabbage, carrots, celery, collard greens, cucumbers, eggplant, kale, leeks, lemons, lettuce, mushrooms, okra, onions, peas, peppers, potatoes, pumpkin, radishes, rutabaga, shallots, spinach, sweet potatoes, turnips, zucchini - Fruits common to the Mediterranean Diet include: apples, apricots, avocados, cherries, clementines, dates, figs, grapefruits, grapes, melons, nectarines, oranges, peaches, pears, pomegranates, strawberries, tangerines  Fats - Replace butter and margarine  with healthy oils, such as olive oil, canola oil, and tahini  - Limit nuts to no more than a handful a day  - Nuts include walnuts, almonds, pecans, pistachios, pine nuts  - Limit or avoid candied, honey roasted or heavily salted nuts - Olives are central to the Mediterranean diet - can be eaten whole or used in a variety of dishes   Meats Protein - Limiting red meat: no more than a few times a month - When eating red meat: choose lean cuts and keep the portion to the size of deck of cards - Eggs: approx. 0 to 4 times a week  - Fish and lean poultry: at least 2 a week  - Healthy protein sources include, chicken, [place], lean beef, lamb - Increase intake of seafood such as tuna, salmon, trout, mackerel, shrimp, scallops - Avoid or limit high fat processed meats such as sausage and bacon  Dairy - Include moderate amounts of low fat dairy products  - Focus on healthy dairy such as fat free  yogurt, skim milk, low or reduced fat cheese - Limit dairy products higher in fat such as whole or 2% milk, cheese, ice cream  Alcohol - Moderate amounts of red wine is ok  - No more than 5 oz daily for women (all ages) and men older than age 52  - No more than 10 oz of wine daily for men younger than 25  Other - Limit sweets and other desserts  - Use herbs and spices instead of salt to flavor foods  - Herbs and spices common to the traditional Mediterranean Diet include: basil, bay leaves, chives, cloves, cumin, fennel, garlic, lavender, marjoram, mint, oregano, parsley, pepper, rosemary, sage, savory, sumac, tarragon, thyme   It's not just a diet, it's a lifestyle:  The Mediterranean diet includes lifestyle factors typical of those in the region  Foods, drinks and meals are best eaten with others and savored Daily physical activity is important for overall good health This could be strenuous exercise like running and aerobics This could also be more leisurely activities such as walking, housework, yard-work, or taking the stairs Moderation is the key; a balanced and healthy diet accommodates most foods and drinks Consider portion sizes and frequency of consumption of certain foods   Meal Ideas & Options:  Breakfast:  Whole wheat toast or whole wheat English muffins with peanut butter & hard boiled egg Steel cut oats topped with apples & cinnamon and skim milk  Fresh fruit: banana, strawberries, melon, berries, peaches  Smoothies: strawberries, bananas, greek yogurt, peanut butter Low fat greek yogurt with blueberries and granola  Egg white omelet with spinach and mushrooms Breakfast couscous: whole wheat couscous, apricots, skim milk, cranberries  Sandwiches:  Hummus and grilled vegetables (peppers, zucchini, squash) on whole wheat bread   Grilled chicken on whole wheat pita with lettuce, tomatoes, cucumbers or tzatziki  [place] salad on whole wheat bread: tuna salad made with greek  yogurt, olives, red peppers, capers, green onions Garlic rosemary lamb pita: lamb sauted with garlic, rosemary, salt & pepper; add lettuce, cucumber, greek yogurt to pita - flavor with lemon juice and black pepper  Seafood:  Mediterranean grilled salmon, seasoned with garlic, basil, parsley, lemon juice and black pepper Shrimp, lemon, and spinach whole-grain pasta salad made with low fat greek yogurt  Seared scallops with lemon orzo  Seared tuna steaks seasoned salt, pepper, coriander topped with tomato mixture of olives, tomatoes, olive oil, minced garlic, parsley, green onions and  cappers  Meats:  Herbed greek chicken salad with kalamata olives, cucumber, feta  Red bell peppers stuffed with spinach, bulgur, lean ground beef (or lentils) & topped with feta   Kebabs: skewers of chicken, tomatoes, onions, zucchini, squash  [place] burgers: made with red onions, mint, dill, lemon juice, feta cheese topped with roasted red peppers Vegetarian Cucumber salad: cucumbers, artichoke hearts, celery, red onion, feta cheese, tossed in olive oil & lemon juice  Hummus and whole grain pita points with a greek salad (lettuce, tomato, feta, olives, cucumbers, red onion) Lentil soup with celery, carrots made with vegetable broth, garlic, salt and pepper  Tabouli salad: parsley, bulgur, mint, scallions, cucumbers, tomato, radishes, lemon juice, olive oil, salt and pepper.   Aim to do some physical activity for 150 minutes per week. This is typically divided into 5 days per week, 30 minutes per day. The activity should be enough to get your heart rate up. Anything is better than nothing if you have time constraints.            This is a list of the screening recommended for you and due dates:  Health Maintenance  Topic Date Due   Colon Cancer Screening  Never done   Flu Shot  11/24/2023*   Pap with HPV screening  06/01/2024   DTaP/Tdap/Td vaccine (2 - Td or Tdap) 09/08/2031   HPV Vaccine  Aged Out    COVID-19 Vaccine  Discontinued   Hepatitis C Screening  Discontinued   HIV Screening  Discontinued  *Topic was postponed. The date shown is not the original due date.     Meds ordered this encounter  Medications   amoxicillin (AMOXIL) 875 MG tablet    Sig: Take 1 tablet (875 mg total) by mouth 2 (two) times daily for 7 days.    Dispense:  14 tablet    Refill:  0   fluconazole (DIFLUCAN) 150 MG tablet    Sig: Take 1 tablet (150 mg total) by mouth daily for 1 dose.    Dispense:  1 tablet    Refill:  0    Follow-up: Return in about 1 year (around 09/11/2024) for Annual Physical.  An After Visit Summary was printed and given to the patient.  Lajuana Matte, FNP [facility] [phone]

## 2023-09-12 NOTE — Assessment & Plan Note (Addendum)
Reports feeling exhausted, possibly due to sinusitis or low vitamin D and iron levels. -Order labs to check Vitamin D

## 2023-09-12 NOTE — Assessment & Plan Note (Addendum)
Reports heavy, painful periods. Currently managed with birth control and ibuprofen. -Continue current management plan under your gynecologist - labs drawn, Await labs/testing for assessment and recommendations

## 2023-09-12 NOTE — Assessment & Plan Note (Signed)
Complaints of sinus congestion and pressure for a week. No fever or severe symptoms. Currently taking Zyrtec and Advil Allergy and Congestion. No antibiotic allergies, but has a sulfur allergy. -Prescribe Amoxicillin for 7 days. -Continue probiotics with antibiotics to prevent GI upset. -Order Fluconazole as a precaution for potential yeast infection.

## 2023-09-13 LAB — COMPREHENSIVE METABOLIC PANEL
ALT: 13 [IU]/L (ref 0–32)
AST: 16 [IU]/L (ref 0–40)
Albumin: 4.3 g/dL (ref 3.9–4.9)
Alkaline Phosphatase: 86 [IU]/L (ref 44–121)
BUN/Creatinine Ratio: 10 (ref 9–23)
BUN: 8 mg/dL (ref 6–24)
Bilirubin Total: <0.2 mg/dL (ref 0.0–1.2)
CO2: 21 mmol/L (ref 20–29)
Calcium: 9.5 mg/dL (ref 8.7–10.2)
Chloride: 102 mmol/L (ref 96–106)
Creatinine, Ser: 0.78 mg/dL (ref 0.57–1.00)
Globulin, Total: 2.9 g/dL (ref 1.5–4.5)
Glucose: 76 mg/dL (ref 70–99)
Potassium: 4.6 mmol/L (ref 3.5–5.2)
Sodium: 138 mmol/L (ref 134–144)
Total Protein: 7.2 g/dL (ref 6.0–8.5)
eGFR: 95 mL/min/{1.73_m2} (ref >59)

## 2023-09-13 LAB — CBC WITH DIFFERENTIAL/PLATELET
Basophils Absolute: 0 10*3/uL (ref 0.0–0.2)
Basos: 1 %
EOS (ABSOLUTE): 0.1 10*3/uL (ref 0.0–0.4)
Eos: 1 %
Hematocrit: 35.1 % (ref 34.0–46.6)
Hemoglobin: 10.9 g/dL — ABNORMAL LOW (ref 11.1–15.9)
Immature Grans (Abs): 0 10*3/uL (ref 0.0–0.1)
Immature Granulocytes: 0 %
Lymphocytes Absolute: 2.2 10*3/uL (ref 0.7–3.1)
Lymphs: 28 %
MCH: 24.4 pg — ABNORMAL LOW (ref 26.6–33.0)
MCHC: 31.1 g/dL — ABNORMAL LOW (ref 31.5–35.7)
MCV: 79 fL (ref 79–97)
Monocytes Absolute: 0.6 10*3/uL (ref 0.1–0.9)
Monocytes: 7 %
Neutrophils Absolute: 5 10*3/uL (ref 1.4–7.0)
Neutrophils: 63 %
Platelets: 391 10*3/uL (ref 150–450)
RBC: 4.47 x10E6/uL (ref 3.77–5.28)
RDW: 14.2 % (ref 11.7–15.4)
WBC: 7.9 10*3/uL (ref 3.4–10.8)

## 2023-09-13 LAB — VITAMIN D 25 HYDROXY (VIT D DEFICIENCY, FRACTURES): Vit D, 25-Hydroxy: 50.1 ng/mL (ref 30.0–100.0)

## 2023-09-13 LAB — LIPID PANEL
Chol/HDL Ratio: 3 {ratio} (ref 0.0–4.4)
Cholesterol, Total: 149 mg/dL (ref 100–199)
HDL: 50 mg/dL (ref >39)
LDL Chol Calc (NIH): 67 mg/dL (ref 0–99)
Triglycerides: 195 mg/dL — ABNORMAL HIGH (ref 0–149)
VLDL Cholesterol Cal: 32 mg/dL (ref 5–40)

## 2023-09-13 LAB — IRON,TIBC AND FERRITIN PANEL
Ferritin: 7 ng/mL — ABNORMAL LOW (ref 15–150)
Iron Saturation: 12 % — ABNORMAL LOW (ref 15–55)
Iron: 59 ug/dL (ref 27–159)
Total Iron Binding Capacity: 500 ug/dL — ABNORMAL HIGH (ref 250–450)
UIBC: 441 ug/dL — ABNORMAL HIGH (ref 131–425)

## 2023-09-13 LAB — TSH: TSH: 0.798 u[IU]/mL (ref 0.450–4.500)

## 2023-09-14 ENCOUNTER — Other Ambulatory Visit: Payer: Self-pay | Admitting: Family Medicine

## 2023-09-14 DIAGNOSIS — D500 Iron deficiency anemia secondary to blood loss (chronic): Secondary | ICD-10-CM

## 2023-09-14 MED ORDER — FERROUS FUMARATE 324 (106 FE) MG PO TABS: ORAL_TABLET | ORAL | 0 refills | Status: DC

## 2023-09-15 ENCOUNTER — Encounter: Payer: BC Managed Care – PPO | Admitting: Nurse Practitioner

## 2023-09-16 ENCOUNTER — Other Ambulatory Visit: Payer: Self-pay | Admitting: Family Medicine

## 2023-09-16 DIAGNOSIS — D500 Iron deficiency anemia secondary to blood loss (chronic): Secondary | ICD-10-CM

## 2023-09-16 MED ORDER — FERROUS FUMARATE 324 (106 FE) MG PO TABS: ORAL_TABLET | ORAL | 0 refills | Status: DC

## 2023-09-16 NOTE — Telephone Encounter (Signed)
Copied from CRM [id number]. Topic: Clinical - Medication Refill >> Sep 16, 2023 10:38 AM Nada Libman H wrote: Most Recent Primary Care Visit:  Provider: Renne Crigler  Department: [facility]  Visit Type: PHYSICAL  Date: 09/12/2023  Medication:  Ferrous Fumarate (HEMOCYTE - 106 MG FE) 324 (106 Fe) MG TABS tablet [[id number]] Has the patient contacted their pharmacy? No (Agent: If no, request that the patient contact the pharmacy for the refill. If patient does not wish to contact the pharmacy document the reason why and proceed with request.) (Agent: If yes, when and what did the pharmacy advise?) Previous request was sent to the wrong pharmacy  Is this the correct pharmacy for this prescription? Yes If no, delete pharmacy and type the correct one.  This is the patient's preferred pharmacy:    [id number] - [place], [place] - [address] AT [place] [address] [place] [address] Phone: [phone] Fax: [phone]   Has the prescription been filled recently? No  Is the patient out of the medication? Yes  Has the patient been seen for an appointment in the last year OR does the patient have an upcoming appointment? No  Can we respond through MyChart? Yes  Agent: Please be advised that Rx refills may take up to 3 business days. We ask that you follow-up with your pharmacy.

## 2023-09-17 ENCOUNTER — Encounter: Payer: BC Managed Care – PPO | Admitting: Physician Assistant

## 2023-10-09 ENCOUNTER — Other Ambulatory Visit: Payer: Self-pay | Admitting: Family Medicine

## 2023-10-09 DIAGNOSIS — D500 Iron deficiency anemia secondary to blood loss (chronic): Secondary | ICD-10-CM

## 2023-10-21 ENCOUNTER — Other Ambulatory Visit: Payer: Self-pay

## 2023-10-21 ENCOUNTER — Other Ambulatory Visit: Payer: BC Managed Care – PPO

## 2023-10-21 DIAGNOSIS — D500 Iron deficiency anemia secondary to blood loss (chronic): Secondary | ICD-10-CM

## 2023-10-22 LAB — CBC WITH DIFFERENTIAL/PLATELET
Basophils Absolute: 0.1 10*3/uL (ref 0.0–0.2)
Basos: 1 %
EOS (ABSOLUTE): 0.1 10*3/uL (ref 0.0–0.4)
Eos: 2 %
Hematocrit: 38.5 % (ref 34.0–46.6)
Hemoglobin: 12.2 g/dL (ref 11.1–15.9)
Immature Grans (Abs): 0 10*3/uL (ref 0.0–0.1)
Immature Granulocytes: 0 %
Lymphocytes Absolute: 2 10*3/uL (ref 0.7–3.1)
Lymphs: 29 %
MCH: 25.8 pg — ABNORMAL LOW (ref 26.6–33.0)
MCHC: 31.7 g/dL (ref 31.5–35.7)
MCV: 82 fL (ref 79–97)
Monocytes Absolute: 0.6 10*3/uL (ref 0.1–0.9)
Monocytes: 9 %
Neutrophils Absolute: 4.2 10*3/uL (ref 1.4–7.0)
Neutrophils: 59 %
Platelets: 359 10*3/uL (ref 150–450)
RBC: 4.72 x10E6/uL (ref 3.77–5.28)
RDW: 18.3 % — ABNORMAL HIGH (ref 11.7–15.4)
WBC: 7 10*3/uL (ref 3.4–10.8)

## 2023-10-22 LAB — IRON,TIBC AND FERRITIN PANEL
Ferritin: 24 ng/mL (ref 15–150)
Iron Saturation: 14 % — ABNORMAL LOW (ref 15–55)
Iron: 61 ug/dL (ref 27–159)
Total Iron Binding Capacity: 422 ug/dL (ref 250–450)
UIBC: 361 ug/dL (ref 131–425)

## 2023-11-10 ENCOUNTER — Other Ambulatory Visit: Payer: Self-pay | Admitting: Physician Assistant

## 2023-11-10 DIAGNOSIS — E782 Mixed hyperlipidemia: Secondary | ICD-10-CM

## 2023-11-21 DIAGNOSIS — J0100 Acute maxillary sinusitis, unspecified: Secondary | ICD-10-CM | POA: Diagnosis not present

## 2023-11-21 DIAGNOSIS — H9203 Otalgia, bilateral: Secondary | ICD-10-CM | POA: Diagnosis not present

## 2024-08-02 LAB — HM MAMMOGRAPHY: HM Mammogram: NORMAL

## 2024-08-16 LAB — HM PAP SMEAR: HM Pap smear: NORMAL

## 2024-09-13 ENCOUNTER — Ambulatory Visit: Payer: BC Managed Care – PPO | Admitting: Family Medicine

## 2024-09-13 ENCOUNTER — Encounter: Payer: Self-pay | Admitting: Family Medicine

## 2024-09-13 VITALS — BP 118/78 | HR 66 | Temp 98.0°F | Resp 18 | Ht 67.0 in | Wt 219.8 lb

## 2024-09-13 DIAGNOSIS — E66811 Obesity, class 1: Secondary | ICD-10-CM | POA: Diagnosis not present

## 2024-09-13 DIAGNOSIS — E6609 Other obesity due to excess calories: Secondary | ICD-10-CM | POA: Diagnosis not present

## 2024-09-13 DIAGNOSIS — E782 Mixed hyperlipidemia: Secondary | ICD-10-CM | POA: Insufficient documentation

## 2024-09-13 DIAGNOSIS — M25531 Pain in right wrist: Secondary | ICD-10-CM | POA: Diagnosis not present

## 2024-09-13 DIAGNOSIS — D500 Iron deficiency anemia secondary to blood loss (chronic): Secondary | ICD-10-CM | POA: Diagnosis not present

## 2024-09-13 DIAGNOSIS — J019 Acute sinusitis, unspecified: Secondary | ICD-10-CM | POA: Diagnosis not present

## 2024-09-13 DIAGNOSIS — Z0001 Encounter for general adult medical examination with abnormal findings: Secondary | ICD-10-CM | POA: Insufficient documentation

## 2024-09-13 DIAGNOSIS — Z6834 Body mass index (BMI) 34.0-34.9, adult: Secondary | ICD-10-CM

## 2024-09-13 DIAGNOSIS — E559 Vitamin D deficiency, unspecified: Secondary | ICD-10-CM

## 2024-09-13 DIAGNOSIS — R079 Chest pain, unspecified: Secondary | ICD-10-CM | POA: Diagnosis not present

## 2024-09-13 MED ORDER — PREDNISONE 20 MG PO TABS: 20.0000 mg | ORAL_TABLET | Freq: Every day | ORAL | 0 refills | Status: AC

## 2024-09-13 NOTE — Assessment & Plan Note (Addendum)
 Iron deficiency anemia due to chronic blood loss Previous iron deficiency anemia due to uterine thinning. Not on iron supplements. Lab Results  Component Value Date   IRON 61 10/21/2023   TIBC 422 10/21/2023   FERRITIN 24 10/21/2023    - Ordered iron studies to assess current iron levels. - Will reorder iron supplements if levels are low. Orders:   Iron, TIBC and Ferritin Panel

## 2024-09-13 NOTE — Assessment & Plan Note (Addendum)
 Mixed hyperlipidemia Cholesterol levels slightly elevated. On Crestor  for management. Last lipids Lab Results  Component Value Date   CHOL 149 09/12/2023   HDL 50 09/12/2023   LDLCALC 67 09/12/2023   TRIG 195 (H) 09/12/2023   CHOLHDL 3.0 09/12/2023   - Continue Crestor  for cholesterol management. Orders:   CBC with Differential   Comprehensive metabolic panel with GFR   Lipid Panel

## 2024-09-13 NOTE — Progress Notes (Signed)
 "  Subjective:  Patient ID: Madison Caldwell, female    DOB: 1976/06/30  Age: 49 y.o. MRN: [id number]  Chief Complaint  Patient presents with   Annual Exam    Well Adult Physical: Patient here for a comprehensive physical exam.The patient reports problems - carpal tunnel and chest pain. Do you take any herbs or supplements that were not prescribed by a doctor? yes Are you taking calcium  supplements? no Are you taking aspirin daily? no  Encounter for general adult medical examination without abnormal findings  Physical (At Risk items are starred): Patient's last physical exam was 1 year ago .  Patient is not afflicted from Stress Incontinence and Urge Incontinence  Patient wears a seat belts Patient has smoke detectors and has carbon monoxide detectors. Patient practices appropriate gun safety. Patient wears sunscreen with extended sun exposure. Dental Care: biannual cleanings, brushes and flosses daily. Ophthalmology/Optometry: Annual visit.  Hearing loss: none Vision impairments: none  Menarche: 15 Menstrual History: normal LMP: 08/16/2024 Pregnancy history: one Safe at home: yes Self breast exams: yes  Discussed the use of AI scribe software for clinical note transcription with the patient, who gave verbal consent to proceed.  History of Present Illness   Madison Caldwell is a 49 year old female who presents for an annual physical exam.  Iron deficiency - Previously treated with iron supplements, completed course - Deficiency attributed to uterine thinning - Not currently taking iron supplements - Interested in iron studies to assess current iron status  Hyperlipidemia and cardiovascular symptoms - Currently taking Crestor  5 mg for elevated cholesterol - Fasting today for laboratory evaluation of cholesterol levels - No shortness of breath - Occasional sharp, heartburn-like pain, centrally located in the chest, non-radiating - Family history of heart disease:  father with coronary issues, grandfather had myocardial infarction in his fifties  Vitamin d  deficiency - History of vitamin D  deficiency - Currently out of vitamin D  supplements - Awaiting laboratory results before refilling prescription  Chronic sinus symptoms - Chronic sinus issues persisting into current year - Trial of Flonase without improvement - No sore throat, nausea, vomiting, diarrhea, or constipation  Wrist pain - History of wrist pain, suspected carpal tunnel syndrome - Flare-up earlier this year, currently asymptomatic - Not using wrist brace at night  Gastroesophageal reflux symptoms - Taking Prilosec for reflux  General health maintenance - Taking multivitamin, birth control, Allegra and Mucinex as needed - Not currently taking Ferrous fumarate  - Engaging in healthy diet and regular exercise, at least 150 minutes per week         09/13/2024    8:54 AM 09/12/2023   11:00 AM 09/10/2022    9:03 AM 09/10/2022    9:02 AM 09/07/2021    8:02 AM  Depression screen PHQ 2/9  Decreased Interest 0 0 0 0 0  Down, Depressed, Hopeless 0 0 0 0 0  PHQ - 2 Score 0 0 0 0 0  Altered sleeping 0 0     Tired, decreased energy 0 0     Change in appetite 0 0     Feeling bad or failure about yourself  0 0     Trouble concentrating 0 0     Moving slowly or fidgety/restless 0 0     Suicidal thoughts 0 0     PHQ-9 Score 0 0      Difficult doing work/chores Not difficult at all Not difficult at all        Data  saved with a previous flowsheet row definition         02/13/2021    2:08 PM 09/07/2021    8:02 AM 09/10/2022    9:02 AM 09/12/2023   11:00 AM 09/13/2024    8:54 AM  Fall Risk  Falls in the past year? 0 0 0 0 0  Was there an injury with Fall? 0  0  0  0  0  Fall Risk Category Calculator 0 0 0 0 0  Fall Risk Category (Retired) Low  Low      (RETIRED) Patient Fall Risk Level Low fall risk  Low fall risk      Patient at Risk for Falls Due to No Fall Risks No Fall Risks No  Fall Risks No Fall Risks No Fall Risks  Fall risk Follow up Falls evaluation completed  Falls evaluation completed  Falls evaluation completed  Falls evaluation completed Falls evaluation completed     Data saved with a previous flowsheet row definition             Social Hx   Social History   Socioeconomic History   Marital status: Married    Spouse name: Not on file   Number of children: Not on file   Years of education: Not on file   Highest education level: Associate degree: occupational, scientist, product/process development, or vocational program  Occupational History   Not on file  Tobacco Use   Smoking status: Never   Smokeless tobacco: Never  Substance and Sexual Activity   Alcohol use: Not Currently   Drug use: Never   Sexual activity: Not on file  Other Topics Concern   Not on file  Social History Narrative   Not on file   Social Drivers of Health   Tobacco Use: Low Risk (09/13/2024)   Patient History    Smoking Tobacco Use: Never    Smokeless Tobacco Use: Never    Passive Exposure: Not on file  Financial Resource Strain: Low Risk (09/09/2024)   Overall Financial Resource Strain (CARDIA)    Difficulty of Paying Living Expenses: Not hard at all  Food Insecurity: No Food Insecurity (09/09/2024)   Epic    Worried About Programme Researcher, Broadcasting/film/video in the Last Year: Never true    Ran Out of Food in the Last Year: Never true  Transportation Needs: No Transportation Needs (09/09/2024)   Epic    Lack of Transportation (Medical): No    Lack of Transportation (Non-Medical): No  Physical Activity: Inactive (09/09/2024)   Exercise Vital Sign    Days of Exercise per Week: 0 days    Minutes of Exercise per Session: Not on file  Stress: No Stress Concern Present (09/09/2024)   [employer] of Occupational Health - Occupational Stress Questionnaire    Feeling of Stress: Not at all  Social Connections: Moderately Integrated (09/09/2024)   Social Connection and Isolation Panel    Frequency of  Communication with Friends and Family: Once a week    Frequency of Social Gatherings with Friends and Family: Once a week    Attends Religious Services: More than 4 times per year    Active Member of Clubs or Organizations: Yes    Attends Banker Meetings: More than 4 times per year    Marital Status: Married  Depression (PHQ2-9): Low Risk (09/13/2024)   Depression (PHQ2-9)    PHQ-2 Score: 0  Alcohol Screen: Low Risk (09/12/2023)   Alcohol Screen    Last Alcohol Screening Score (  AUDIT): 0  Housing: Low Risk (09/09/2024)   Epic    Unable to Pay for Housing in the Last Year: No    Number of Times Moved in the Last Year: 0    Homeless in the Last Year: No  Utilities: Not At Risk (09/12/2023)   [facility] Utilities    Threatened with loss of utilities: No  Health Literacy: Not on file   History reviewed. No pertinent past medical history. Past Surgical History:  Procedure Laterality Date   BREAST BIOPSY Right 08/06/2022   BREAST BIOPSY Right 08/06/2022   US  RT BREAST BX W LOC DEV 1ST LESION IMG BX SPEC US  GUIDE 08/06/2022 [facility]    History reviewed. No pertinent family history.  Review of Systems  Constitutional:  Negative for chills, diaphoresis, fatigue and fever.  HENT:  Negative for congestion, ear pain and sinus pain.   Respiratory:  Negative for cough and shortness of breath.   Cardiovascular:  Positive for chest pain (occasional).  Gastrointestinal:  Negative for abdominal pain, constipation, nausea and vomiting.  Genitourinary:  Negative for dysuria.  Musculoskeletal:  Positive for arthralgias (right wrist pain - interm).  Neurological:  Negative for weakness and headaches.  Psychiatric/Behavioral:  Positive for dysphoric mood. The patient is not nervous/anxious.      Objective:  BP 118/78   Pulse 66   Temp 98 F (36.7 C) (Temporal)   Resp 18   Ht 5' 7 (1.702 m)   Wt 219 lb 12.8 oz (99.7 kg)   SpO2 100%   BMI 34.43 kg/m      09/13/2024     8:43 AM 09/12/2023   10:54 AM 09/10/2022    9:03 AM  BP/Weight  Systolic BP 118 134 136  Diastolic BP 78 78 74  Wt. (Lbs) 219.8 215 229  BMI 34.43 kg/m2 33.67 kg/m2 35.87 kg/m2    Physical Exam Vitals reviewed.  Constitutional:      General: She is not in acute distress.    Appearance: Normal appearance. She is well-groomed. She is obese.  HENT:     Head: Normocephalic.     Right Ear: Hearing, ear canal and external ear normal. A middle ear effusion is present.     Left Ear: Hearing, ear canal and external ear normal. A middle ear effusion is present.     Nose: Congestion and rhinorrhea present.     Right Sinus: No maxillary sinus tenderness or frontal sinus tenderness.     Left Sinus: No maxillary sinus tenderness or frontal sinus tenderness.     Mouth/Throat:     Mouth: Mucous membranes are moist.     Pharynx: Oropharynx is clear. No oropharyngeal exudate or posterior oropharyngeal erythema.     Tonsils: No tonsillar exudate. 1+ on the right. 1+ on the left.  Eyes:     Extraocular Movements: Extraocular movements intact.     Conjunctiva/sclera: Conjunctivae normal.     Pupils: Pupils are equal, round, and reactive to light.  Cardiovascular:     Rate and Rhythm: Normal rate and regular rhythm.     Pulses: Normal pulses.     Heart sounds: Normal heart sounds. No murmur heard. Pulmonary:     Effort: Pulmonary effort is normal. No respiratory distress.     Breath sounds: Normal breath sounds. No wheezing.  Abdominal:     General: Bowel sounds are normal. There is no distension.     Palpations: Abdomen is soft. There is no mass.     Tenderness:  There is no abdominal tenderness.  Musculoskeletal:        General: Normal range of motion.     Cervical back: Normal range of motion and neck supple. No tenderness.  Lymphadenopathy:     Cervical: No cervical adenopathy.  Skin:    General: Skin is warm and dry.     Findings: Erythema (facial) present.  Neurological:     Mental  Status: She is alert and oriented to person, place, and time. Mental status is at baseline.     Cranial Nerves: Cranial nerves 2-12 are intact. No cranial nerve deficit.     Sensory: Sensation is intact.     Motor: Motor function is intact. No weakness.     Coordination: Coordination is intact.     Gait: Gait is intact. Gait normal.     Deep Tendon Reflexes: Reflexes are normal and symmetric.  Psychiatric:        Attention and Perception: Attention normal.        Mood and Affect: Mood normal. Affect is tearful.        Speech: Speech normal.        Behavior: Behavior normal.        Thought Content: Thought content normal.        Judgment: Judgment normal.     Lab Results  Component Value Date   WBC 7.0 10/21/2023   HGB 12.2 10/21/2023   HCT 38.5 10/21/2023   PLT 359 10/21/2023   GLUCOSE 76 09/12/2023   CHOL 149 09/12/2023   TRIG 195 (H) 09/12/2023   HDL 50 09/12/2023   LDLCALC 67 09/12/2023   ALT 13 09/12/2023   AST 16 09/12/2023   NA 138 09/12/2023   K 4.6 09/12/2023   CL 102 09/12/2023   CREATININE 0.78 09/12/2023   BUN 8 09/12/2023   CO2 21 09/12/2023   TSH 0.798 09/12/2023   HGBA1C 5.6 09/03/2021      Assessment & Plan:   Assessment & Plan Encounter for general adult medical examination with abnormal findings Up to date on vaccines and screenings - gets pap at GYN office  Things to do to keep yourself healthy  - Exercise at least 30-45 minutes a day, 3-4 days a week.  - Eat a low-fat diet with lots of fruits and vegetables, up to 7-9 servings per day.  - Seatbelts can save your life. Wear them always.  - Smoke detectors on every level of your home, check batteries every year.  - Eye Doctor - have an eye exam every 1-2 years  - Alcohol -  If you drink, do it moderately, less than 2 drinks per day.  - Health Care Power of Attorney. Choose someone to speak for you if you are not able.  - Depression is common in our stressful world.If you're feeling down or  losing interest in things you normally enjoy, please come in for a visit.  - Violence - If anyone is threatening or hurting you, please call immediately.  Orders:   T4, free   TSH  Chest pain, unspecified type Chest pain with family history of premature cardiac disease Intermittent sharp chest pain resembling heartburn, non-radiating, not exertion-related. Family history of premature cardiac disease. On Crestor  for cholesterol. - Ordered baseline EKG. NSR, ventricular rate 66, PR 158, QRS 78, QTc 440, No ST elevation or depression.  - Referred to cardiology for further evaluation, including potential echocardiogram and calcium  score CT. Orders:   EKG 12-Lead   Ambulatory referral to  Cardiology  Iron deficiency anemia due to chronic blood loss Iron deficiency anemia due to chronic blood loss Previous iron deficiency anemia due to uterine thinning. Not on iron supplements. Lab Results  Component Value Date   IRON 61 10/21/2023   TIBC 422 10/21/2023   FERRITIN 24 10/21/2023    - Ordered iron studies to assess current iron levels. - Will reorder iron supplements if levels are low. Orders:   Iron, TIBC and Ferritin Panel  Mixed hyperlipidemia Mixed hyperlipidemia Cholesterol levels slightly elevated. On Crestor  for management. Last lipids Lab Results  Component Value Date   CHOL 149 09/12/2023   HDL 50 09/12/2023   LDLCALC 67 09/12/2023   TRIG 195 (H) 09/12/2023   CHOLHDL 3.0 09/12/2023   - Continue Crestor  for cholesterol management. Orders:   CBC with Differential   Comprehensive metabolic panel with GFR   Lipid Panel  Vitamin D  deficiency Vitamin D  deficiency Currently out of vitamin D  supplements. - Ordered vitamin D  level. - Will reorder vitamin D  supplements if levels are low. Orders:   Vitamin D , 25-hydroxy  Acute sinusitis with symptoms > 10 days Acute sinusitis with persistent symptoms. Mild effusion in bilateral ears.  Persistent sinusitis symptoms,  including fluid in sinuses and frequent sneezing. Flonase ineffective. - Prescribed prednisone  20 mg in the morning with breakfast for 5 days. - Advised to contact if symptoms worsen or if fever develops for potential antibiotic treatment. Orders:   predniSONE  (DELTASONE ) 20 MG tablet; Take 1 tablet (20 mg total) by mouth daily with breakfast for 5 days.  Right wrist pain Suspected carpal tunnel syndrome Intermittent wrist pain with previous symptoms suggestive of carpal tunnel syndrome. Symptoms flared this year but not currently painful. - Advised wearing a wrist brace at night to alleviate symptoms. - Will consider referral to orthopedics or sports medicine if symptoms worsen.     Class 1 obesity due to excess calories with serious comorbidity and body mass index (BMI) of 34.0 to 34.9 in adult BMI 34.43, not at goal - Aim to do some physical activity for 150 minutes per week. This is typically divided into 5 days per week, 30 minutes per day. The activity should be enough to get your heart rate up. Anything is better than nothing if you have time constraints.        These are the goals we discussed:  Goals      Activity and Exercise Increased     Evidence-based guidance:  Review current exercise levels.  Assess patient perspective on exercise or activity level, barriers to increasing activity, motivation and readiness for change.  Recommend or set healthy exercise goal based on individual tolerance.  Encourage small steps toward making change in amount of exercise or activity.  Urge reduction of sedentary activities or screen time.  Promote group activities within the community or with family or support person.  Consider referral to rehabiliation therapist for assessment and exercise/activity plan.   Notes:  Check on heart health, recent passing of father     DIET - EAT MORE FRUITS AND VEGETABLES     Eat healthy.     Mediterranean Diet  Why follow it? Research  shows Those who follow the Mediterranean diet have a reduced risk of heart disease  The diet is associated with a reduced incidence of Parkinson's and Alzheimer's diseases People following the diet may have longer life expectancies and lower rates of chronic diseases  The Dietary Guidelines for Americans recommends the Mediterranean diet as an  eating plan to promote health and prevent disease  What Is the Mediterranean Diet?  Healthy eating plan based on typical foods and recipes of Mediterranean-style cooking The diet is primarily a plant based diet; these foods should make up a majority of meals   Starches - Plant based foods should make up a majority of meals - They are an important sources of vitamins, minerals, energy, antioxidants, and fiber - Choose whole grains, foods high in fiber and minimally processed items  - Typical grain sources include wheat, oats, barley, corn, brown rice, bulgar, farro, millet, polenta, couscous  - Various types of beans include chickpeas, lentils, fava beans, black beans, white beans   Fruits  Veggies - Large quantities of antioxidant rich fruits & veggies; 6 or more servings  - Vegetables can be eaten raw or lightly drizzled with oil and cooked  - Vegetables common to the traditional Mediterranean Diet include: artichokes, arugula, beets, broccoli, brussel sprouts, cabbage, carrots, celery, collard greens, cucumbers, eggplant, kale, leeks, lemons, lettuce, mushrooms, okra, onions, peas, peppers, potatoes, pumpkin, radishes, rutabaga, shallots, spinach, sweet potatoes, turnips, zucchini - Fruits common to the Mediterranean Diet include: apples, apricots, avocados, cherries, clementines, dates, figs, grapefruits, grapes, melons, nectarines, oranges, peaches, pears, pomegranates, strawberries, tangerines  Fats - Replace butter and margarine with healthy oils, such as olive oil, canola oil, and tahini  - Limit nuts to no more than a handful a day  - Nuts include  walnuts, almonds, pecans, pistachios, pine nuts  - Limit or avoid candied, honey roasted or heavily salted nuts - Olives are central to the [employer] - can be eaten whole or used in a variety of dishes   Meats Protein - Limiting red meat: no more than a few times a month - When eating red meat: choose lean cuts and keep the portion to the size of deck of cards - Eggs: approx. 0 to 4 times a week  - Fish and lean poultry: at least 2 a week  - Healthy protein sources include, chicken, turkey, lean beef, lamb - Increase intake of seafood such as tuna, salmon, trout, mackerel, shrimp, scallops - Avoid or limit high fat processed meats such as sausage and bacon  Dairy - Include moderate amounts of low fat dairy products  - Focus on healthy dairy such as fat free yogurt, skim milk, low or reduced fat cheese - Limit dairy products higher in fat such as whole or 2% milk, cheese, ice cream  Alcohol - Moderate amounts of red wine is ok  - No more than 5 oz daily for women (all ages) and men older than age 84  - No more than 10 oz of wine daily for men younger than 41  Other - Limit sweets and other desserts  - Use herbs and spices instead of salt to flavor foods  - Herbs and spices common to the traditional Mediterranean Diet include: basil, bay leaves, chives, cloves, cumin, fennel, garlic, lavender, marjoram, mint, oregano, parsley, pepper, rosemary, sage, savory, sumac, tarragon, thyme   Its not just a diet, its a lifestyle:  The Mediterranean diet includes lifestyle factors typical of those in the region  Foods, drinks and meals are best eaten with others and savored Daily physical activity is important for overall good health This could be strenuous exercise like running and aerobics This could also be more leisurely activities such as walking, housework, yard-work, or taking the stairs Moderation is the key; a balanced and healthy diet accommodates most foods  and drinks Consider  portion sizes and frequency of consumption of certain foods   Meal Ideas & Options:  Breakfast:  Whole wheat toast or whole wheat English muffins with peanut butter & hard boiled egg Steel cut oats topped with apples & cinnamon and skim milk  Fresh fruit: banana, strawberries, melon, berries, peaches  Smoothies: strawberries, bananas, greek yogurt, peanut butter Low fat greek yogurt with blueberries and granola  Egg white omelet with spinach and mushrooms Breakfast couscous: whole wheat couscous, apricots, skim milk, cranberries  Sandwiches:  Hummus and grilled vegetables (peppers, zucchini, squash) on whole wheat bread   Grilled chicken on whole wheat pita with lettuce, tomatoes, cucumbers or tzatziki  Tuna salad on whole wheat bread: tuna salad made with greek yogurt, olives, red peppers, capers, green onions Garlic rosemary lamb pita: lamb sauted with garlic, rosemary, salt & pepper; add lettuce, cucumber, greek yogurt to pita - flavor with lemon juice and black pepper  Seafood:  Mediterranean grilled salmon, seasoned with garlic, basil, parsley, lemon juice and black pepper Shrimp, lemon, and spinach whole-grain pasta salad made with low fat greek yogurt  Seared scallops with lemon orzo  Seared tuna steaks seasoned salt, pepper, coriander topped with tomato mixture of olives, tomatoes, olive oil, minced garlic, parsley, green onions and cappers  Meats:  Herbed greek chicken salad with kalamata olives, cucumber, feta  Red bell peppers stuffed with spinach, bulgur, lean ground beef (or lentils) & topped with feta   Kebabs: skewers of chicken, tomatoes, onions, zucchini, squash  Turkey burgers: made with red onions, mint, dill, lemon juice, feta cheese topped with roasted red peppers Vegetarian Cucumber salad: cucumbers, artichoke hearts, celery, red onion, feta cheese, tossed in olive oil & lemon juice  Hummus and whole grain pita points with a greek salad (lettuce, tomato, feta,  olives, cucumbers, red onion) Lentil soup with celery, carrots made with vegetable broth, garlic, salt and pepper  Tabouli salad: parsley, bulgur, mint, scallions, cucumbers, tomato, radishes, lemon juice, olive oil, salt and pepper.   Aim to do some physical activity for 150 minutes per week. This is typically divided into 5 days per week, 30 minutes per day. The activity should be enough to get your heart rate up. Anything is better than nothing if you have time constraints.            This is a list of the screening recommended for you and due dates:  Health Maintenance  Topic Date Due   Flu Shot  11/23/2024*   Hepatitis B Vaccine (1 of 3 - 19+ 3-dose series) 09/13/2025*   Breast Cancer Screening  08/02/2026   Pap with HPV screening  08/17/2027   DTaP/Tdap/Td vaccine (2 - Td or Tdap) 09/08/2031   Colon Cancer Screening  12/02/2032   HPV Vaccine (No Doses Required) Completed   Pneumococcal Vaccine  Aged Out   Meningitis B Vaccine  Aged Out   COVID-19 Vaccine  Discontinued   Hepatitis C Screening  Discontinued   HIV Screening  Discontinued  *Topic was postponed. The date shown is not the original due date.     Meds ordered this encounter  Medications   predniSONE  (DELTASONE ) 20 MG tablet    Sig: Take 1 tablet (20 mg total) by mouth daily with breakfast for 5 days.    Dispense:  5 tablet    Refill:  0    Follow-up: No follow-ups on file.  An After Visit Summary was printed and given to the patient.  Harrie  Teressa, FNP [facility] [phone]    "

## 2024-09-13 NOTE — Assessment & Plan Note (Addendum)
 Up to date on vaccines and screenings - gets pap at GYN office  Things to do to keep yourself healthy  - Exercise at least 30-45 minutes a day, 3-4 days a week.  - Eat a low-fat diet with lots of fruits and vegetables, up to 7-9 servings per day.  - Seatbelts can save your life. Wear them always.  - Smoke detectors on every level of your home, check batteries every year.  - Eye Doctor - have an eye exam every 1-2 years  - Alcohol -  If you drink, do it moderately, less than 2 drinks per day.  - Health Care Power of Attorney. Choose someone to speak for you if you are not able.  - Depression is common in our stressful world.If you're feeling down or losing interest in things you normally enjoy, please come in for a visit.  - Violence - If anyone is threatening or hurting you, please call immediately.  Orders:   T4, free   TSH

## 2024-09-13 NOTE — Assessment & Plan Note (Addendum)
 Vitamin D  deficiency Currently out of vitamin D  supplements. - Ordered vitamin D  level. - Will reorder vitamin D  supplements if levels are low. Orders:   Vitamin D , 25-hydroxy

## 2024-09-13 NOTE — Assessment & Plan Note (Addendum)
 Acute sinusitis with persistent symptoms. Mild effusion in bilateral ears.  Persistent sinusitis symptoms, including fluid in sinuses and frequent sneezing. Flonase ineffective. - Prescribed prednisone  20 mg in the morning with breakfast for 5 days. - Advised to contact if symptoms worsen or if fever develops for potential antibiotic treatment. Orders:   predniSONE  (DELTASONE ) 20 MG tablet; Take 1 tablet (20 mg total) by mouth daily with breakfast for 5 days.

## 2024-09-13 NOTE — Assessment & Plan Note (Addendum)
 BMI 34.43, not at goal - Aim to do some physical activity for 150 minutes per week. This is typically divided into 5 days per week, 30 minutes per day. The activity should be enough to get your heart rate up. Anything is better than nothing if you have time constraints.

## 2024-09-13 NOTE — Assessment & Plan Note (Addendum)
 Chest pain with family history of premature cardiac disease Intermittent sharp chest pain resembling heartburn, non-radiating, not exertion-related. Family history of premature cardiac disease. On Crestor  for cholesterol. - Ordered baseline EKG. NSR, ventricular rate 66, PR 158, QRS 78, QTc 440, No ST elevation or depression.  - Referred to cardiology for further evaluation, including potential echocardiogram and calcium  score CT. Orders:   EKG 12-Lead   Ambulatory referral to Cardiology

## 2024-09-13 NOTE — Assessment & Plan Note (Signed)
 Suspected carpal tunnel syndrome Intermittent wrist pain with previous symptoms suggestive of carpal tunnel syndrome. Symptoms flared this year but not currently painful. - Advised wearing a wrist brace at night to alleviate symptoms. - Will consider referral to orthopedics or sports medicine if symptoms worsen.

## 2024-09-14 ENCOUNTER — Ambulatory Visit: Payer: Self-pay | Admitting: Family Medicine

## 2024-09-14 ENCOUNTER — Other Ambulatory Visit: Payer: Self-pay

## 2024-09-14 ENCOUNTER — Other Ambulatory Visit: Payer: Self-pay | Admitting: Family Medicine

## 2024-09-14 DIAGNOSIS — E781 Pure hyperglyceridemia: Secondary | ICD-10-CM

## 2024-09-14 DIAGNOSIS — E559 Vitamin D deficiency, unspecified: Secondary | ICD-10-CM

## 2024-09-14 LAB — CBC WITH DIFFERENTIAL/PLATELET
Basophils Absolute: 0.1 x10E3/uL (ref 0.0–0.2)
Basos: 1 %
EOS (ABSOLUTE): 0.2 x10E3/uL (ref 0.0–0.4)
Eos: 3 %
Hematocrit: 42.4 % (ref 34.0–46.6)
Hemoglobin: 14.1 g/dL (ref 11.1–15.9)
Immature Grans (Abs): 0 x10E3/uL (ref 0.0–0.1)
Immature Granulocytes: 0 %
Lymphocytes Absolute: 1.9 x10E3/uL (ref 0.7–3.1)
Lymphs: 28 %
MCH: 28.7 pg (ref 26.6–33.0)
MCHC: 33.3 g/dL (ref 31.5–35.7)
MCV: 86 fL (ref 79–97)
Monocytes Absolute: 0.4 x10E3/uL (ref 0.1–0.9)
Monocytes: 6 %
Neutrophils Absolute: 4.1 x10E3/uL (ref 1.4–7.0)
Neutrophils: 62 %
Platelets: 380 x10E3/uL (ref 150–450)
RBC: 4.91 x10E6/uL (ref 3.77–5.28)
RDW: 12.9 % (ref 11.7–15.4)
WBC: 6.7 x10E3/uL (ref 3.4–10.8)

## 2024-09-14 LAB — COMPREHENSIVE METABOLIC PANEL WITH GFR
ALT: 16 IU/L (ref 0–32)
AST: 18 IU/L (ref 0–40)
Albumin: 4.6 g/dL (ref 3.9–4.9)
Alkaline Phosphatase: 65 IU/L (ref 41–116)
BUN/Creatinine Ratio: 10 (ref 9–23)
BUN: 9 mg/dL (ref 6–24)
Bilirubin Total: 0.3 mg/dL (ref 0.0–1.2)
CO2: 19 mmol/L — ABNORMAL LOW (ref 20–29)
Calcium: 9.7 mg/dL (ref 8.7–10.2)
Chloride: 103 mmol/L (ref 96–106)
Creatinine, Ser: 0.87 mg/dL (ref 0.57–1.00)
Globulin, Total: 2.9 g/dL (ref 1.5–4.5)
Glucose: 74 mg/dL (ref 70–99)
Potassium: 4.8 mmol/L (ref 3.5–5.2)
Sodium: 140 mmol/L (ref 134–144)
Total Protein: 7.5 g/dL (ref 6.0–8.5)
eGFR: 83 mL/min/1.73

## 2024-09-14 LAB — LIPID PANEL
Chol/HDL Ratio: 3.3 ratio (ref 0.0–4.4)
Cholesterol, Total: 179 mg/dL (ref 100–199)
HDL: 55 mg/dL
LDL Chol Calc (NIH): 69 mg/dL (ref 0–99)
Triglycerides: 352 mg/dL — ABNORMAL HIGH (ref 0–149)
VLDL Cholesterol Cal: 55 mg/dL — ABNORMAL HIGH (ref 5–40)

## 2024-09-14 LAB — IRON,TIBC AND FERRITIN PANEL
Ferritin: 16 ng/mL (ref 15–150)
Iron Saturation: 22 % (ref 15–55)
Iron: 118 ug/dL (ref 27–159)
Total Iron Binding Capacity: 527 ug/dL — ABNORMAL HIGH (ref 250–450)
UIBC: 409 ug/dL (ref 131–425)

## 2024-09-14 LAB — TSH: TSH: 0.536 u[IU]/mL (ref 0.450–4.500)

## 2024-09-14 LAB — T4, FREE: Free T4: 1.09 ng/dL (ref 0.82–1.77)

## 2024-09-14 LAB — VITAMIN D 25 HYDROXY (VIT D DEFICIENCY, FRACTURES): Vit D, 25-Hydroxy: 33.2 ng/mL (ref 30.0–100.0)

## 2024-09-14 MED ORDER — FENOFIBRATE 48 MG PO TABS: 48.0000 mg | ORAL_TABLET | Freq: Every day | ORAL | 3 refills | Status: AC

## 2024-09-14 MED ORDER — VITAMIN D (ERGOCALCIFEROL) 1.25 MG (50000 UNIT) PO CAPS: 50000.0000 [IU] | ORAL_CAPSULE | ORAL | 2 refills | Status: AC

## 2024-09-21 ENCOUNTER — Other Ambulatory Visit (HOSPITAL_COMMUNITY): Payer: Self-pay

## 2024-09-21 ENCOUNTER — Telehealth: Payer: Self-pay | Admitting: Pharmacy Technician

## 2024-09-21 ENCOUNTER — Telehealth: Payer: Self-pay

## 2024-09-21 ENCOUNTER — Ambulatory Visit

## 2024-09-21 VITALS — BP 134/96 | HR 78 | Ht 67.75 in | Wt 222.1 lb

## 2024-09-21 DIAGNOSIS — R002 Palpitations: Secondary | ICD-10-CM | POA: Diagnosis not present

## 2024-09-21 DIAGNOSIS — Z9189 Other specified personal risk factors, not elsewhere classified: Secondary | ICD-10-CM | POA: Insufficient documentation

## 2024-09-21 DIAGNOSIS — R079 Chest pain, unspecified: Secondary | ICD-10-CM

## 2024-09-21 DIAGNOSIS — E782 Mixed hyperlipidemia: Secondary | ICD-10-CM | POA: Diagnosis not present

## 2024-09-21 MED ORDER — ICOSAPENT ETHYL 1 G PO CAPS: 2.0000 g | ORAL_CAPSULE | Freq: Two times a day (BID) | ORAL | 3 refills | Status: AC

## 2024-09-21 NOTE — Assessment & Plan Note (Signed)
 The 10-year ASCVD risk score (Arnett DK, et al., 2019) is: 0.9%   Values used to calculate the score:     Age: 49 years     Clinically relevant sex: Female     Is Non-Hispanic African American: No     Diabetic: No     Tobacco smoker: No     Systolic Blood Pressure: 134 mmHg     Is BP treated: No     HDL Cholesterol: 55 mg/dL     Total Cholesterol: 179 mg/dL  Risk calculated using American Heart Association prevent online calculator is 1.6% for 10-year cardiovascular disease risk and 12.7% for 30-year risk of cardiovascular disease.  Overall her sedentary nature contributes to her cardiovascular risk apart from being overweight.  Currently asymptomatic.  Her description of chest discomfort appears noncardiac.  Reassured her. Further risk stratification with calcium  score study discussed.  She is agreeable.  If elevated calcium  score, we will increase lipid-lowering therapy intensity.

## 2024-09-21 NOTE — Assessment & Plan Note (Signed)
 Hypertriglyceridemia. Currently on rosuvastatin  5 mg once daily and fenofibrate .  Specifically dietary recommendations to reduce fatty food intake recommended. Recommended refined fish oil Vascepa  2 g twice daily if cost covered and affordable. If not start fish oil generic over-the-counter.

## 2024-09-21 NOTE — Progress Notes (Signed)
 "  Cardiology Consultation:    Date:  09/21/2024   ID:  Madison Caldwell, DOB 1975-09-19, MRN [id number]  PCP:  Teressa Harrie HERO, FNP  Cardiologist:  Alean SAUNDERS Koraima Albertsen, MD   Referring MD: Teressa Harrie HERO, FNP   No chief complaint on file.    ASSESSMENT AND PLAN:   Madison Caldwell 49/F  hypertriglyceridemia, obesity. No prior history of CAD, CHF, MI, CVA. No prior echocardiogram or stress test. Reports family history with grandparents heart disease in their 21s and 77s. Does not smoke [smoked few times in college], no alcohol consumption.  No illicit drug use.   Here to further discuss her risk for heart disease.  Problem List Items Addressed This Visit       Other   Chest pain - Primary   noncardiac description chest pain. Proceed with management for heartburn as per primary team.       Relevant Orders   EKG 12-Lead (Completed)   Mixed hyperlipidemia   Hypertriglyceridemia. Currently on rosuvastatin  5 mg once daily and fenofibrate .  Specifically dietary recommendations to reduce fatty food intake recommended. Recommended refined fish oil Vascepa  2 g twice daily if cost covered and affordable. If not start fish oil generic over-the-counter.       Relevant Medications   icosapent  Ethyl (VASCEPA ) 1 g capsule   Other Relevant Orders   CT CARDIAC SCORING   At increased risk for cardiovascular disease   The 10-year ASCVD risk score (Arnett DK, et al., 2019) is: 0.9%   Values used to calculate the score:     Age: 49 years     Clinically relevant sex: Female     Is Non-Hispanic African American: No     Diabetic: No     Tobacco smoker: No     Systolic Blood Pressure: 134 mmHg     Is BP treated: No     HDL Cholesterol: 55 mg/dL     Total Cholesterol: 179 mg/dL  Risk calculated using American Heart Association prevent online calculator is 1.6% for 10-year cardiovascular disease risk and 12.7% for 30-year risk of cardiovascular disease.  Overall her sedentary nature  contributes to her cardiovascular risk apart from being overweight.  Currently asymptomatic.  Her description of chest discomfort appears noncardiac.  Reassured her. Further risk stratification with calcium  score study discussed.  She is agreeable.  If elevated calcium  score, we will increase lipid-lowering therapy intensity.      Palpitations    palpitations once or twice every other week.  Bothersome when they do occur and occur randomly without any obvious trigger. Based on description appears to be related to ectopic beats without any sustained arrhythmias. No significant symptoms such as chest pain, shortness of breath syncope.  Will obtain Zio patch 14-day study to rule out any significant arrhythmias.       Relevant Orders   LONG TERM MONITOR (3-14 DAYS)   Return to clinic tentatively in 3 months.   History of Present Illness:    Madison Caldwell is a 49 y.o. female who is being seen today for the evaluation of cardiovascular disease risk given family history at the request of Teressa Harrie HERO, FNP.   Pleasant woman here for the visit by herself.  Has twins age 84 year old at home.  She works a office manager.  No regular exercise.  History of hypertriglyceridemia, obesity. No prior history of CAD, CHF, MI, CVA. No prior echocardiogram or stress test. Reports family history with grandparents heart disease in  their 33s and 60s. Does not smoke [smoked few times in college], no alcohol consumption.  No illicit drug use. Drinks couple cups of coffee a day and a cup of tea.    Describes a sensation of atypical sharp to burning kind of chest discomfort which he attributes to heartburn.  This occurs infrequently and short lasting.  Well-controlled on Prilosec.  Denies any significant symptoms of chest pain or shortness of breath with exertion.  Describes sensation of palpitations that occurs once or twice every other week where she feels her heart rate is irregular or skipping and  associated with a forceful heartbeat sensation.  Typically noticed when she is laying down to sleep.  Has not woken her up from sleep.  No persistent episodes.  Denies any orthopnea paroxysmal nocturnal dyspnea.  Mentions blood pressures are typically well-controlled. Has an upper arm blood pressure cuff at home which she uses to monitor occasionally. Blood pressure here in the office today elevated attributes to being anxious about the visit.  For hypertriglyceridemia she has been taking rosuvastatin  for 3 to 4 years and has been on fenofibrate  started recently.  Blood work recently from 09/13/2024 notes total cholesterol 179, HDL 55, LDL 69, triglycerides 352. TSH normal 0.536 Hemoglobin 14.1, hematocrit 42.4, WBC 6.7 and platelets 380. BUN 9, creatinine 0.87 Sodium 140 and potassium 4.8 Normal transaminases and alkaline phosphatase   EKG in the clinic today sshows sinus rhythm with heart rate 78/min, PR interval 170 ms, QRS duration 74 ms, QTc 399 ms, no ischemic changes  Past Medical History:  Diagnosis Date   Abnormal uterine bleeding due to adenomyosis 09/12/2023   Acute sinusitis with symptoms > 10 days 09/12/2023   Chest pain 09/13/2024   Class 1 obesity due to excess calories with serious comorbidity and body mass index (BMI) of 34.0 to 34.9 in adult 09/13/2024   Encounter for general adult medical examination with abnormal findings 09/13/2024   Encounter for general adult medical examination without abnormal findings 09/12/2023   Iron deficiency anemia due to chronic blood loss 09/13/2024   Mixed hyperlipidemia 09/13/2024   Right wrist pain 09/13/2024   Vitamin D  deficiency 09/12/2023    Past Surgical History:  Procedure Laterality Date   BREAST BIOPSY Right 08/06/2022   BREAST BIOPSY Right 08/06/2022   US  RT BREAST BX W LOC DEV 1ST LESION IMG BX SPEC US  GUIDE 08/06/2022 [facility]    Current Medications: Active Medications[1]   Allergies:   Clindamycin,  Sulfa antibiotics, and Sulfamethoxazole-trimethoprim   Social History   Socioeconomic History   Marital status: Married    Spouse name: Not on file   Number of children: Not on file   Years of education: Not on file   Highest education level: Associate degree: occupational, scientist, product/process development, or vocational program  Occupational History   Not on file  Tobacco Use   Smoking status: Never   Smokeless tobacco: Never  Substance and Sexual Activity   Alcohol use: Not Currently   Drug use: Never   Sexual activity: Not on file  Other Topics Concern   Not on file  Social History Narrative   Not on file   Social Drivers of Health   Tobacco Use: Low Risk (09/21/2024)   Patient History    Smoking Tobacco Use: Never    Smokeless Tobacco Use: Never    Passive Exposure: Not on file  Financial Resource Strain: Low Risk (09/09/2024)   Overall Financial Resource Strain (CARDIA)    Difficulty of Paying  Living Expenses: Not hard at all  Food Insecurity: No Food Insecurity (09/09/2024)   Epic    Worried About Programme Researcher, Broadcasting/film/video in the Last Year: Never true    Ran Out of Food in the Last Year: Never true  Transportation Needs: No Transportation Needs (09/09/2024)   Epic    Lack of Transportation (Medical): No    Lack of Transportation (Non-Medical): No  Physical Activity: Inactive (09/09/2024)   Exercise Vital Sign    Days of Exercise per Week: 0 days    Minutes of Exercise per Session: Not on file  Stress: No Stress Concern Present (09/09/2024)   [employer] of Occupational Health - Occupational Stress Questionnaire    Feeling of Stress: Not at all  Social Connections: Moderately Integrated (09/09/2024)   Social Connection and Isolation Panel    Frequency of Communication with Friends and Family: Once a week    Frequency of Social Gatherings with Friends and Family: Once a week    Attends Religious Services: More than 4 times per year    Active Member of Clubs or Organizations: Yes     Attends Banker Meetings: More than 4 times per year    Marital Status: Married  Depression (PHQ2-9): Low Risk (09/13/2024)   Depression (PHQ2-9)    PHQ-2 Score: 0  Alcohol Screen: Low Risk (09/12/2023)   Alcohol Screen    Last Alcohol Screening Score (AUDIT): 0  Housing: Low Risk (09/09/2024)   Epic    Unable to Pay for Housing in the Last Year: No    Number of Times Moved in the Last Year: 0    Homeless in the Last Year: No  Utilities: Not At Risk (09/12/2023)   [employer] Utilities    Threatened with loss of utilities: No  Health Literacy: Not on file     Family History: The patient's family history includes Atrial fibrillation in her father; Cancer in her maternal grandmother and mother; Hypertension in her mother. ROS:   Please see the history of present illness.    All 14 point review of systems negative except as described per history of present illness.  EKGs/Labs/Other Studies Reviewed:    The following studies were reviewed today:   EKG:  EKG Interpretation Date/Time:  Tuesday September 21 2024 09:40:27 EST Ventricular Rate:  78 PR Interval:  170 QRS Duration:  74 QT Interval:  350 QTC Calculation: 399 R Axis:   70  Text Interpretation: Normal sinus rhythm Normal ECG No previous ECGs available Confirmed by Liborio Hai reddy [phone]) on 09/21/2024 10:17:30 AM    Recent Labs: 09/13/2024: ALT 16; BUN 9; Creatinine, Ser 0.87; Hemoglobin 14.1; Platelets 380; Potassium 4.8; Sodium 140; TSH 0.536  Recent Lipid Panel    Component Value Date/Time   CHOL 179 09/13/2024 0946   TRIG 352 (H) 09/13/2024 0946   HDL 55 09/13/2024 0946   CHOLHDL 3.3 09/13/2024 0946   LDLCALC 69 09/13/2024 0946    Physical Exam:    VS:  BP (!) 134/96   Pulse 78   Ht 5' 7.75 (1.721 m)   Wt 222 lb 2 oz (100.8 kg)   SpO2 99%   BMI 34.02 kg/m     Wt Readings from Last 3 Encounters:  09/21/24 222 lb 2 oz (100.8 kg)  09/13/24 219 lb 12.8 oz (99.7 kg)  09/12/23 215 lb (97.5  kg)     GENERAL:  Well nourished, well developed in no acute distress NECK: No JVD; No carotid bruits  CARDIAC: RRR, S1 and S2 present, no murmurs, no rubs, no gallops CHEST:  Clear to auscultation without rales, wheezing or rhonchi  Extremities: No pitting pedal edema. Pulses bilaterally symmetric with radial 2+ and dorsalis pedis 2+ NEUROLOGIC:  Alert and oriented x 3  Medication Adjustments/Labs and Tests Ordered: Current medicines are reviewed at length with the patient today.  Concerns regarding medicines are outlined above.  Orders Placed This Encounter  Procedures   CT CARDIAC SCORING   LONG TERM MONITOR (3-14 DAYS)   EKG 12-Lead   Meds ordered this encounter  Medications   icosapent  Ethyl (VASCEPA ) 1 g capsule    Sig: Take 2 capsules (2 g total) by mouth 2 (two) times daily.    Dispense:  360 capsule    Refill:  3    Signed, Eytan Carrigan reddy Lavalle Skoda, MD, MPH, [facility]. 09/21/2024 10:57 AM    [facility]    [1]  Current Meds  Medication Sig   Chlorphen-Phenyleph-Ibuprofen (ADVIL ALLERGY & CONGESTION PO) Take by mouth.   fenofibrate  (TRICOR ) 48 MG tablet Take 1 tablet (48 mg total) by mouth daily.   Ferrous Fumarate  (FERROCITE) 324 (106 Fe) MG TABS tablet TAKE ONE TABLET BY MOUTH ONCE DAILY ON AN EMPTY STOMACH, ONE HOUR BEFORE OR 2 HOURS AFTER FOOD.   fexofenadine (ALLEGRA) 180 MG tablet Take 180 mg by mouth daily.   icosapent  Ethyl (VASCEPA ) 1 g capsule Take 2 capsules (2 g total) by mouth 2 (two) times daily.   Multiple Vitamin (MULTIVITAMIN) tablet Take 1 tablet by mouth daily.   Norethindrone Acetate-Ethinyl Estrad-FE (HAILEY 24 FE) 1-20 MG-MCG(24) tablet Take 1 tablet by mouth daily.   omeprazole (PRILOSEC) 40 MG capsule Take 40 mg by mouth daily.   pseudoephedrine-guaifenesin (MUCINEX D) 60-600 MG 12 hr tablet Take 1 tablet by mouth every 12 (twelve) hours.   rosuvastatin  (CRESTOR ) 5 MG tablet TAKE 1 TABLET DAILY   Vitamin D , Ergocalciferol ,  (DRISDOL ) 1.25 MG (50000 UNIT) CAPS capsule Take 1 capsule (50,000 Units total) by mouth every 7 (seven) days.   "

## 2024-09-21 NOTE — Patient Instructions (Addendum)
 Medication Instructions:  Your physician has recommended you make the following change in your medication:   Start Vascepa  2 grams twice daily.  [email or web address]  *If you need a refill on your cardiac medications before your next appointment, please call your pharmacy*   Lab Work: None ordered If you have labs (blood work) drawn today and your tests are completely normal, you will receive your results only by: MyChart Message (if you have MyChart) OR A paper copy in the mail If you have any lab test that is abnormal or we need to change your treatment, we will call you to review the results.   Testing/Procedures: A zio monitor was ordered today. It will remain on for 14 days. Remove 10/05/2024. You will then return monitor and event diary in provided box. It takes 1-2 weeks for report to be downloaded and returned to us . We will call you with the results. If monitor falls off or has orange flashing light, please call Zio for further instructions.  We will order CT coronary calcium  score. It will cost $99.00 and is due at time of scan.  Please call to schedule.    [facility] [address] [place], [place] [address] [phone]  Follow-Up: At [facility], you and your health needs are our priority.  As part of our continuing mission to provide you with exceptional heart care, we have created designated Provider Care Teams.  These Care Teams include your primary Cardiologist (physician) and Advanced Practice Providers (APPs -  Physician Assistants and Nurse Practitioners) who all work together to provide you with the care you need, when you need it.  We recommend signing up for the patient portal called MyChart.  Sign up information is provided on this After Visit Summary.  MyChart is used to connect with patients for Virtual Visits (Telemedicine).  Patients are able to view lab/test results,  encounter notes, upcoming appointments, etc.  Non-urgent messages can be sent to your provider as well.   To learn more about what you can do with MyChart, go to [email or web address].    Your next appointment:   3 month(s)  The format for your next appointment:   In Person  Provider:   Alean Kobus, MD    Other Instructions none  Important Information About Sugar

## 2024-09-21 NOTE — Telephone Encounter (Signed)
 Pharmacy Patient Advocate Encounter   Received notification from Physician's Office that prior authorization for vascepa  is required/requested.   Insurance verification completed.   The patient is insured through [facility].   Per test claim: PA required; PA submitted to above mentioned insurance via Latent Key/confirmation #/[id number] [facility] Status is pending

## 2024-09-21 NOTE — Telephone Encounter (Signed)
PA needed for Vascepa

## 2024-09-21 NOTE — Assessment & Plan Note (Signed)
 noncardiac description chest pain. Proceed with management for heartburn as per primary team.

## 2024-09-21 NOTE — Assessment & Plan Note (Addendum)
"   palpitations once or twice every other week.  Bothersome when they do occur and occur randomly without any obvious trigger. Based on description appears to be related to ectopic beats without any sustained arrhythmias. No significant symptoms such as chest pain, shortness of breath syncope.  Will obtain Zio patch 14-day study to rule out any significant arrhythmias.  "

## 2024-09-27 NOTE — Telephone Encounter (Signed)
 Pharmacy Patient Advocate Encounter  Received notification from [facility] that Prior Authorization for vascepa  has been DENIED.  Full denial letter will be uploaded to the media tab. See denial reason below.     PA #/Case ID/Reference #: [id number]

## 2024-10-06 ENCOUNTER — Other Ambulatory Visit (HOSPITAL_BASED_OUTPATIENT_CLINIC_OR_DEPARTMENT_OTHER): Admitting: Radiology

## 2024-12-08 ENCOUNTER — Ambulatory Visit: Admitting: Family Medicine

## 2024-12-08 ENCOUNTER — Ambulatory Visit

## 2025-09-14 ENCOUNTER — Encounter: Admitting: Family Medicine
